# Patient Record
Sex: Male | Born: 1955 | Race: White | Hispanic: No | Smoking: Current every day smoker
Health system: Southern US, Community
[De-identification: ages and names within clinical notes are randomized; demographics above are authoritative.]

## PROBLEM LIST (undated history)

## (undated) DIAGNOSIS — J189 Pneumonia, unspecified organism: Secondary | ICD-10-CM

## (undated) DIAGNOSIS — N400 Enlarged prostate without lower urinary tract symptoms: Secondary | ICD-10-CM

## (undated) DIAGNOSIS — M419 Scoliosis, unspecified: Secondary | ICD-10-CM

## (undated) DIAGNOSIS — D696 Thrombocytopenia, unspecified: Secondary | ICD-10-CM

## (undated) DIAGNOSIS — K589 Irritable bowel syndrome without diarrhea: Secondary | ICD-10-CM

## (undated) DIAGNOSIS — A499 Bacterial infection, unspecified: Secondary | ICD-10-CM

## (undated) DIAGNOSIS — J449 Chronic obstructive pulmonary disease, unspecified: Secondary | ICD-10-CM

## (undated) DIAGNOSIS — K635 Polyp of colon: Secondary | ICD-10-CM

## (undated) DIAGNOSIS — B019 Varicella without complication: Secondary | ICD-10-CM

## (undated) DIAGNOSIS — R569 Unspecified convulsions: Secondary | ICD-10-CM

## (undated) DIAGNOSIS — R519 Headache, unspecified: Secondary | ICD-10-CM

## (undated) DIAGNOSIS — F419 Anxiety disorder, unspecified: Secondary | ICD-10-CM

## (undated) DIAGNOSIS — K219 Gastro-esophageal reflux disease without esophagitis: Secondary | ICD-10-CM

## (undated) DIAGNOSIS — N420 Calculus of prostate: Secondary | ICD-10-CM

## (undated) DIAGNOSIS — F329 Major depressive disorder, single episode, unspecified: Secondary | ICD-10-CM

## (undated) DIAGNOSIS — K449 Diaphragmatic hernia without obstruction or gangrene: Secondary | ICD-10-CM

## (undated) DIAGNOSIS — N390 Urinary tract infection, site not specified: Secondary | ICD-10-CM

## (undated) DIAGNOSIS — F32A Depression, unspecified: Secondary | ICD-10-CM

## (undated) DIAGNOSIS — G43909 Migraine, unspecified, not intractable, without status migrainosus: Secondary | ICD-10-CM

## (undated) DIAGNOSIS — E785 Hyperlipidemia, unspecified: Secondary | ICD-10-CM

## (undated) DIAGNOSIS — M1990 Unspecified osteoarthritis, unspecified site: Secondary | ICD-10-CM

## (undated) DIAGNOSIS — R51 Headache: Secondary | ICD-10-CM

## (undated) DIAGNOSIS — G589 Mononeuropathy, unspecified: Secondary | ICD-10-CM

## (undated) HISTORY — DX: Major depressive disorder, single episode, unspecified: F32.9

## (undated) HISTORY — DX: Benign prostatic hyperplasia without lower urinary tract symptoms: N40.0

## (undated) HISTORY — DX: Thrombocytopenia, unspecified: D69.6

## (undated) HISTORY — DX: Migraine, unspecified, not intractable, without status migrainosus: G43.909

## (undated) HISTORY — DX: Calculus of prostate: N42.0

## (undated) HISTORY — DX: Bacterial infection, unspecified: N39.0

## (undated) HISTORY — DX: Diaphragmatic hernia without obstruction or gangrene: K44.9

## (undated) HISTORY — DX: Chronic obstructive pulmonary disease, unspecified: J44.9

## (undated) HISTORY — DX: Urinary tract infection, site not specified: A49.9

## (undated) HISTORY — DX: Depression, unspecified: F32.A

## (undated) HISTORY — DX: Unspecified osteoarthritis, unspecified site: M19.90

## (undated) HISTORY — PX: OTHER SURGICAL HISTORY: SHX169

## (undated) HISTORY — DX: Headache, unspecified: R51.9

## (undated) HISTORY — PX: APPENDECTOMY: SHX54

## (undated) HISTORY — DX: Hyperlipidemia, unspecified: E78.5

## (undated) HISTORY — DX: Polyp of colon: K63.5

## (undated) HISTORY — DX: Unspecified convulsions: R56.9

## (undated) HISTORY — DX: Headache: R51

## (undated) HISTORY — DX: Irritable bowel syndrome, unspecified: K58.9

## (undated) HISTORY — DX: Pneumonia, unspecified organism: J18.9

## (undated) HISTORY — DX: Anxiety disorder, unspecified: F41.9

## (undated) HISTORY — DX: Mononeuropathy, unspecified: G58.9

## (undated) HISTORY — DX: Scoliosis, unspecified: M41.9

## (undated) HISTORY — DX: Gastro-esophageal reflux disease without esophagitis: K21.9

## (undated) HISTORY — DX: Varicella without complication: B01.9

## (undated) HISTORY — PX: HERNIA REPAIR: SHX51

---

## 1996-02-17 HISTORY — PX: HEMORRHOID SURGERY: SHX153

## 1997-05-23 ENCOUNTER — Ambulatory Visit (HOSPITAL_BASED_OUTPATIENT_CLINIC_OR_DEPARTMENT_OTHER): Admission: RE | Admit: 1997-05-23 | Discharge: 1997-05-23 | Payer: Self-pay | Admitting: *Deleted

## 1998-06-26 ENCOUNTER — Other Ambulatory Visit: Admission: RE | Admit: 1998-06-26 | Discharge: 1998-06-26 | Payer: Self-pay | Admitting: Gastroenterology

## 1998-06-27 ENCOUNTER — Ambulatory Visit (HOSPITAL_COMMUNITY): Admission: RE | Admit: 1998-06-27 | Discharge: 1998-06-27 | Payer: Self-pay | Admitting: Gastroenterology

## 1998-07-17 ENCOUNTER — Ambulatory Visit (HOSPITAL_COMMUNITY): Admission: RE | Admit: 1998-07-17 | Discharge: 1998-07-17 | Payer: Self-pay | Admitting: *Deleted

## 2001-01-06 ENCOUNTER — Observation Stay (HOSPITAL_COMMUNITY): Admission: EM | Admit: 2001-01-06 | Discharge: 2001-01-06 | Payer: Self-pay | Admitting: Emergency Medicine

## 2001-01-06 ENCOUNTER — Encounter: Payer: Self-pay | Admitting: Emergency Medicine

## 2001-10-18 ENCOUNTER — Emergency Department (HOSPITAL_COMMUNITY): Admission: EM | Admit: 2001-10-18 | Discharge: 2001-10-18 | Payer: Self-pay | Admitting: Emergency Medicine

## 2001-10-18 ENCOUNTER — Encounter: Payer: Self-pay | Admitting: Emergency Medicine

## 2004-04-09 ENCOUNTER — Ambulatory Visit: Payer: Self-pay | Admitting: Family Medicine

## 2004-12-22 ENCOUNTER — Ambulatory Visit: Payer: Self-pay | Admitting: Pain Medicine

## 2005-01-26 ENCOUNTER — Ambulatory Visit: Payer: Self-pay | Admitting: Pain Medicine

## 2005-02-16 HISTORY — PX: TRANSURETHRAL RESECTION OF PROSTATE: SHX73

## 2005-11-02 ENCOUNTER — Observation Stay (HOSPITAL_COMMUNITY): Admission: EM | Admit: 2005-11-02 | Discharge: 2005-11-03 | Payer: Self-pay | Admitting: Emergency Medicine

## 2005-11-09 ENCOUNTER — Encounter: Admission: RE | Admit: 2005-11-09 | Discharge: 2006-02-07 | Payer: Self-pay | Admitting: Neurosurgery

## 2006-06-23 ENCOUNTER — Encounter: Admission: RE | Admit: 2006-06-23 | Discharge: 2006-06-23 | Payer: Self-pay | Admitting: Internal Medicine

## 2006-07-26 ENCOUNTER — Ambulatory Visit: Payer: Self-pay | Admitting: Gastroenterology

## 2006-07-28 ENCOUNTER — Ambulatory Visit: Payer: Self-pay | Admitting: Cardiology

## 2006-08-04 ENCOUNTER — Encounter: Admission: RE | Admit: 2006-08-04 | Discharge: 2006-08-04 | Payer: Self-pay | Admitting: Neurosurgery

## 2006-08-12 ENCOUNTER — Ambulatory Visit: Payer: Self-pay | Admitting: Gastroenterology

## 2006-08-12 ENCOUNTER — Encounter: Payer: Self-pay | Admitting: Gastroenterology

## 2006-08-24 ENCOUNTER — Encounter (INDEPENDENT_AMBULATORY_CARE_PROVIDER_SITE_OTHER): Payer: Self-pay | Admitting: Interventional Radiology

## 2006-08-24 ENCOUNTER — Other Ambulatory Visit: Admission: RE | Admit: 2006-08-24 | Discharge: 2006-08-24 | Payer: Self-pay | Admitting: Interventional Radiology

## 2006-08-24 ENCOUNTER — Encounter: Admission: RE | Admit: 2006-08-24 | Discharge: 2006-08-24 | Payer: Self-pay | Admitting: Neurosurgery

## 2006-08-26 ENCOUNTER — Ambulatory Visit: Payer: Self-pay | Admitting: Endocrinology

## 2006-08-26 LAB — CONVERTED CEMR LAB
Free T4: 1.1 ng/dL (ref 0.6–1.6)
TSH: 1.43 microintl units/mL (ref 0.35–5.50)

## 2006-10-06 DIAGNOSIS — Z860100 Personal history of colon polyps, unspecified: Secondary | ICD-10-CM | POA: Insufficient documentation

## 2006-10-06 DIAGNOSIS — K219 Gastro-esophageal reflux disease without esophagitis: Secondary | ICD-10-CM

## 2007-02-23 ENCOUNTER — Ambulatory Visit: Payer: Self-pay | Admitting: Cardiovascular Disease

## 2007-05-23 DIAGNOSIS — K5909 Other constipation: Secondary | ICD-10-CM | POA: Insufficient documentation

## 2007-05-23 DIAGNOSIS — E785 Hyperlipidemia, unspecified: Secondary | ICD-10-CM | POA: Insufficient documentation

## 2007-05-23 DIAGNOSIS — E782 Mixed hyperlipidemia: Secondary | ICD-10-CM | POA: Insufficient documentation

## 2007-05-23 DIAGNOSIS — F341 Dysthymic disorder: Secondary | ICD-10-CM | POA: Insufficient documentation

## 2007-05-24 ENCOUNTER — Ambulatory Visit: Payer: Self-pay | Admitting: Internal Medicine

## 2007-05-24 DIAGNOSIS — J449 Chronic obstructive pulmonary disease, unspecified: Secondary | ICD-10-CM | POA: Insufficient documentation

## 2007-06-06 ENCOUNTER — Ambulatory Visit: Payer: Self-pay | Admitting: Internal Medicine

## 2007-06-06 ENCOUNTER — Encounter: Payer: Self-pay | Admitting: Internal Medicine

## 2007-06-23 ENCOUNTER — Ambulatory Visit: Payer: Self-pay | Admitting: Internal Medicine

## 2007-10-31 ENCOUNTER — Encounter: Admission: RE | Admit: 2007-10-31 | Discharge: 2007-10-31 | Payer: Self-pay | Admitting: Internal Medicine

## 2007-12-08 ENCOUNTER — Encounter: Payer: Self-pay | Admitting: Internal Medicine

## 2007-12-20 ENCOUNTER — Ambulatory Visit: Payer: Self-pay | Admitting: Internal Medicine

## 2008-02-28 ENCOUNTER — Inpatient Hospital Stay (HOSPITAL_COMMUNITY): Admission: EM | Admit: 2008-02-28 | Discharge: 2008-02-28 | Payer: Self-pay | Admitting: Emergency Medicine

## 2008-02-28 ENCOUNTER — Encounter (INDEPENDENT_AMBULATORY_CARE_PROVIDER_SITE_OTHER): Payer: Self-pay | Admitting: Surgery

## 2008-08-31 ENCOUNTER — Encounter: Payer: Self-pay | Admitting: Internal Medicine

## 2008-11-20 IMAGING — US US SOFT TISSUE HEAD/NECK
1 series · 14 of 25 positions shown · non-contrast
Comparison: none

CLINICAL DATA: Nodules identified on previous MRI

[Series 1: us soft tissue head/neck · 0.08mm/px · 14 of 58 slices shown]
[im 1/58]
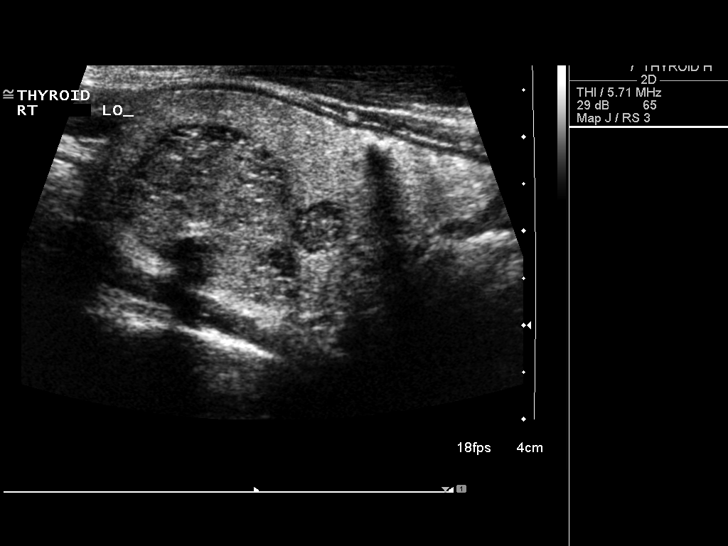
[im 5/58]
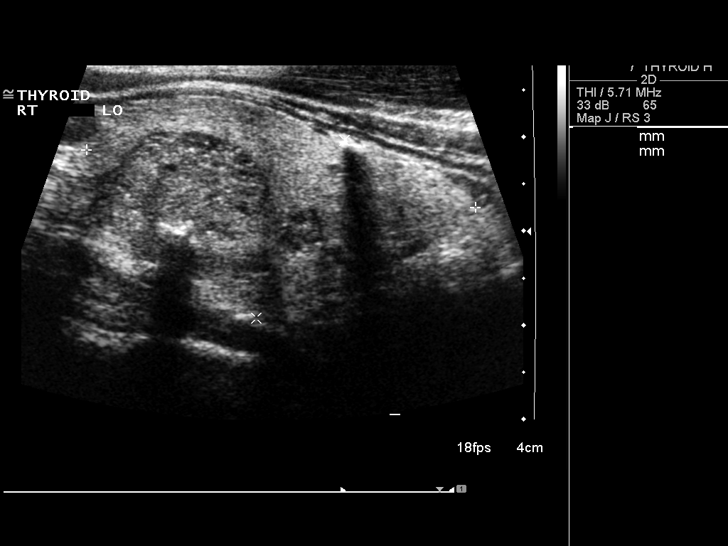
[im 10/58]
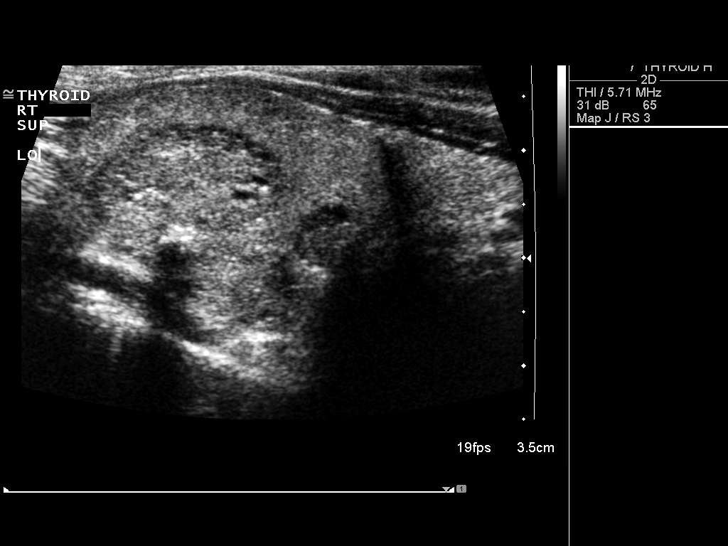
[im 15/58]
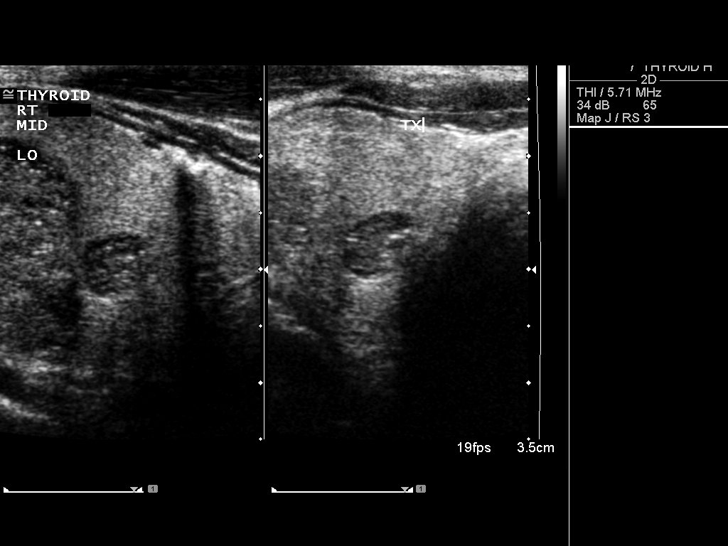
[im 20/58]
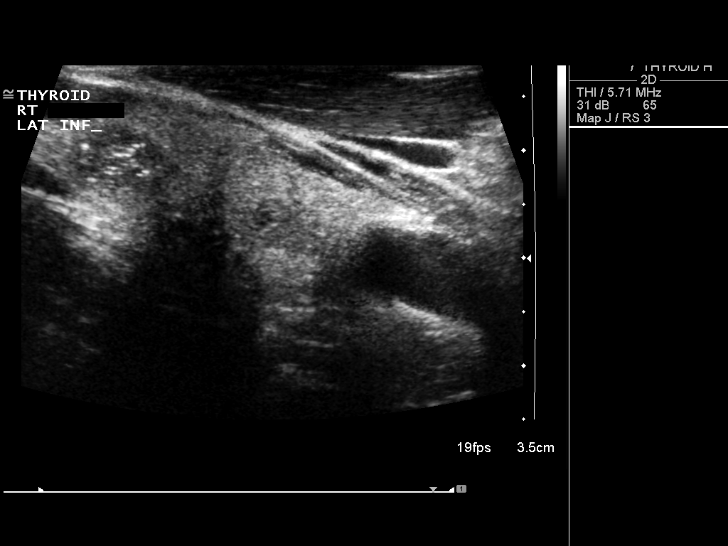
[im 22/58]
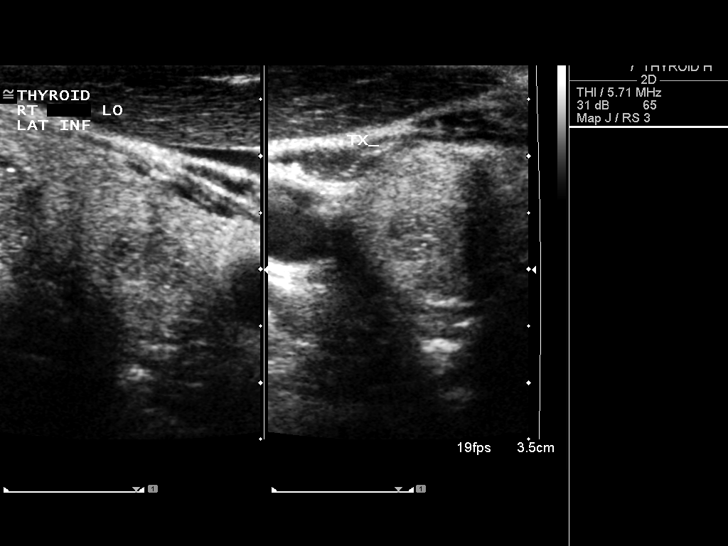
[im 27/58]
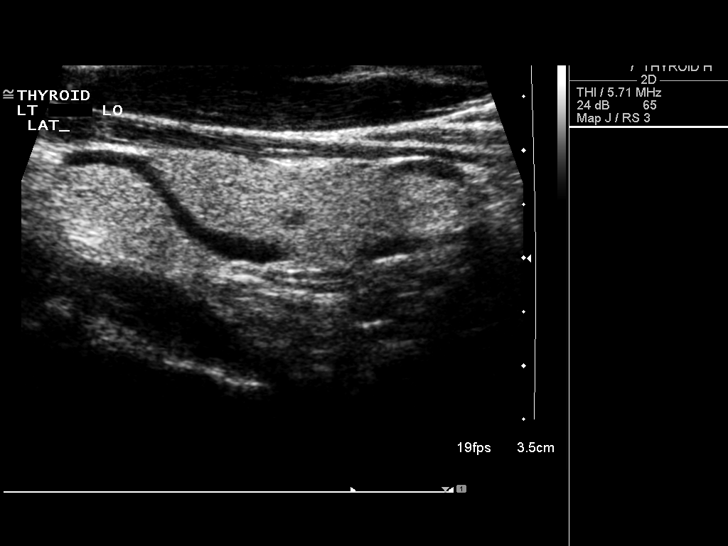
[im 31/58]
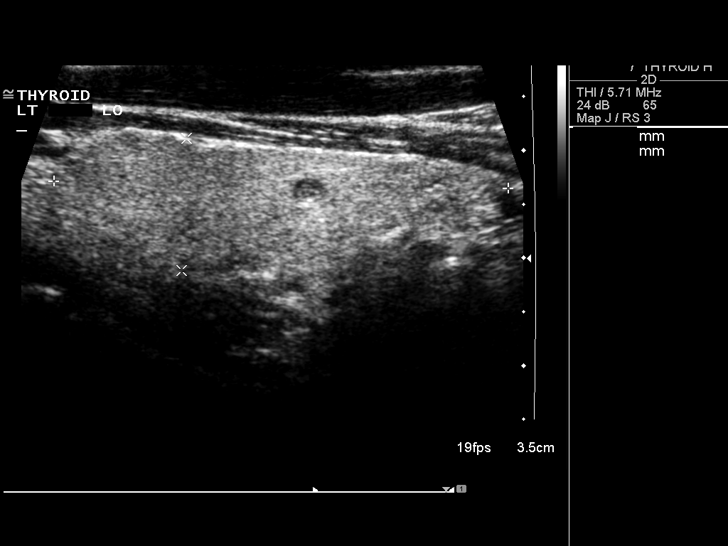
[im 36/58]
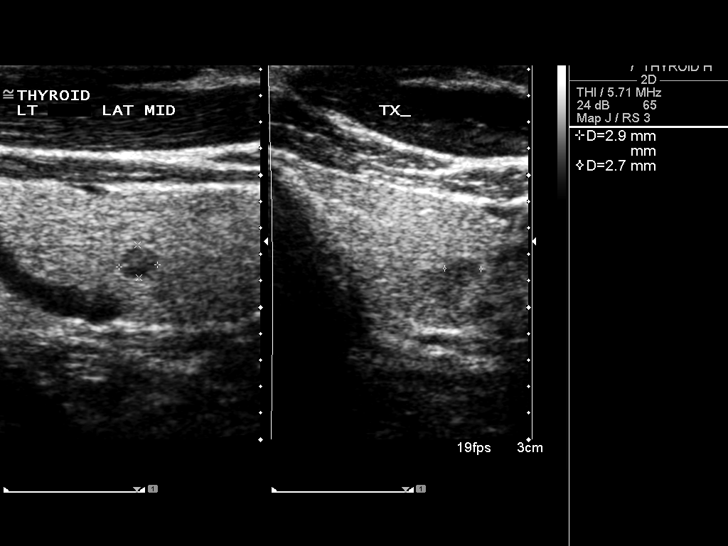
[im 39/58]
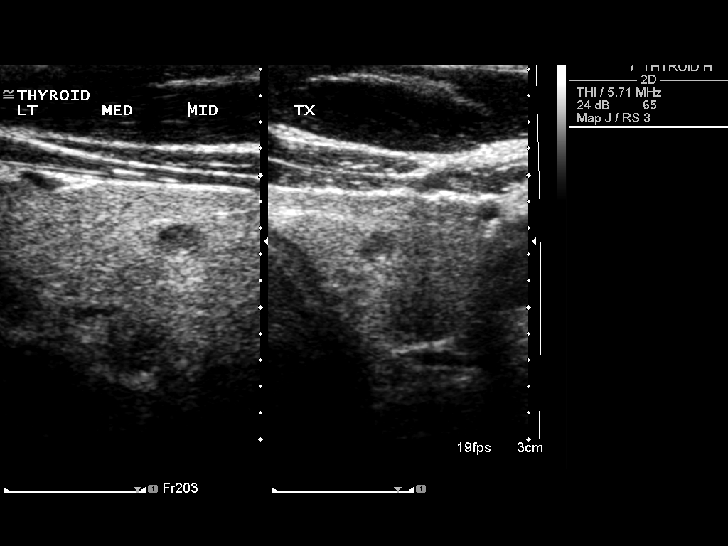
[im 43/58]
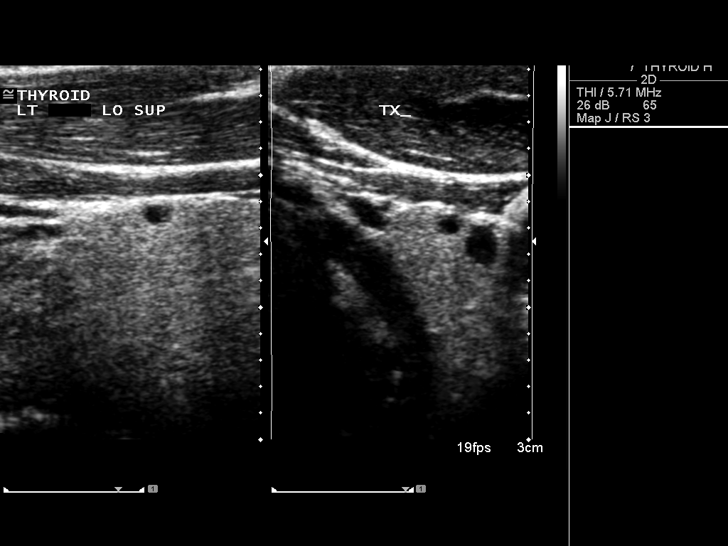
[im 48/58]
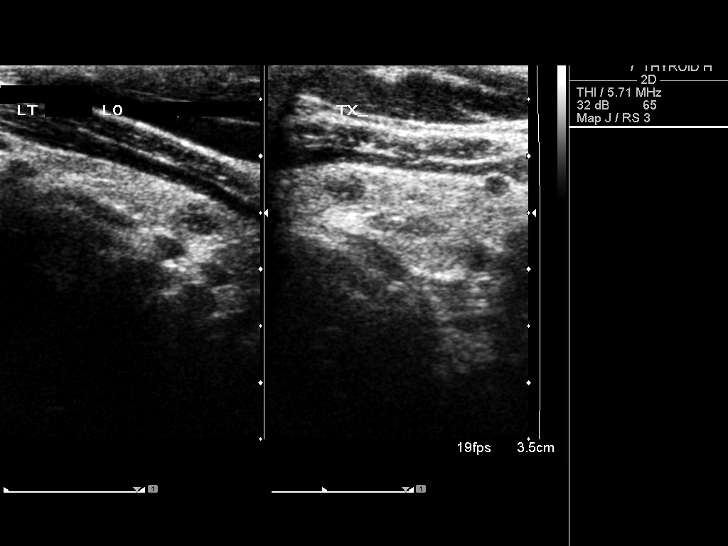
[im 53/58]
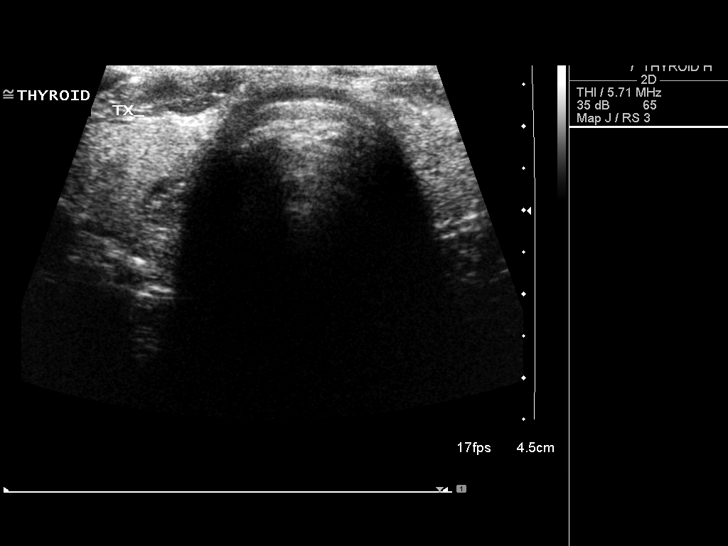
[im 58/58]
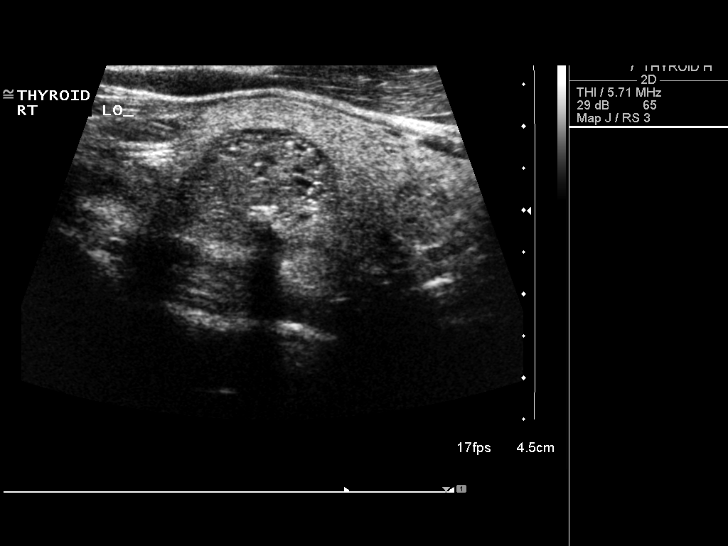

[14 of 25 positions shown; findings below may reference images not displayed]

Ultrasound thyroid:

No previous available for comparison. The right lobe measures 19 x 22 x 44 mm
containing multiple solid-appearing nodules, the largest a dominant bilobed mass
in the mid to upper pole of the right lobe measuring 15 x 18 x 24 mm and
containing microcalcifications. Two smaller subcentimeter nodules are also
evident. Isthmus measures only 2 mm in thickness, unremarkable. The left lobe
measures 13 x 14 x 42 mm, containing scattered tiny hypoechoic or cystic lesions
none measuring greater than 4 mm.
IMPRESSION: 1. Multiple small bilateral thyroid nodules within dominant solid 2.4 cm lesion
in the right lobe. Consider aspiration biopsy to exclude neoplasm.

## 2009-04-25 ENCOUNTER — Encounter: Admission: RE | Admit: 2009-04-25 | Discharge: 2009-04-25 | Payer: Self-pay | Admitting: Internal Medicine

## 2009-04-25 ENCOUNTER — Encounter: Payer: Self-pay | Admitting: Internal Medicine

## 2009-08-13 ENCOUNTER — Encounter: Admission: RE | Admit: 2009-08-13 | Discharge: 2009-08-13 | Payer: Self-pay | Admitting: Internal Medicine

## 2009-11-01 ENCOUNTER — Encounter: Admission: RE | Admit: 2009-11-01 | Discharge: 2009-11-01 | Payer: Self-pay | Admitting: Internal Medicine

## 2010-03-08 ENCOUNTER — Encounter: Payer: Self-pay | Admitting: Internal Medicine

## 2010-03-20 NOTE — Letter (Signed)
Summary: Ralene Ok MD  Ralene Ok MD   Imported By: Sherian Rein 05/20/2009 12:13:18  _____________________________________________________________________  External Attachment:    Type:   Image     Comment:   External Document

## 2010-06-02 LAB — CBC
HCT: 45.7 % (ref 39.0–52.0)
Hemoglobin: 15.5 g/dL (ref 13.0–17.0)
MCHC: 34 g/dL (ref 30.0–36.0)
MCV: 95.4 fL (ref 78.0–100.0)
Platelets: 162 10*3/uL (ref 150–400)
RBC: 4.79 MIL/uL (ref 4.22–5.81)
RDW: 12.9 % (ref 11.5–15.5)
WBC: 15.9 10*3/uL — ABNORMAL HIGH (ref 4.0–10.5)

## 2010-06-02 LAB — URINALYSIS, ROUTINE W REFLEX MICROSCOPIC
Bilirubin Urine: NEGATIVE
Glucose, UA: NEGATIVE mg/dL
Ketones, ur: 40 mg/dL — AB
Leukocytes, UA: NEGATIVE
Nitrite: NEGATIVE
Protein, ur: NEGATIVE mg/dL
Specific Gravity, Urine: 1.017 (ref 1.005–1.030)
Urobilinogen, UA: 1 mg/dL (ref 0.0–1.0)
pH: 7 (ref 5.0–8.0)

## 2010-06-02 LAB — COMPREHENSIVE METABOLIC PANEL
ALT: 11 U/L (ref 0–53)
AST: 27 U/L (ref 0–37)
Albumin: 3.7 g/dL (ref 3.5–5.2)
Alkaline Phosphatase: 122 U/L — ABNORMAL HIGH (ref 39–117)
BUN: 9 mg/dL (ref 6–23)
CO2: 28 mEq/L (ref 19–32)
Calcium: 9.3 mg/dL (ref 8.4–10.5)
Chloride: 101 mEq/L (ref 96–112)
Creatinine, Ser: 0.86 mg/dL (ref 0.4–1.5)
GFR calc Af Amer: >60 mL/min (ref >60)
GFR calc non Af Amer: >60 mL/min (ref >60)
Glucose, Bld: 167 mg/dL — ABNORMAL HIGH (ref 70–99)
Potassium: 5.1 mEq/L (ref 3.5–5.1)
Sodium: 137 mEq/L (ref 135–145)
Total Bilirubin: 1.1 mg/dL (ref 0.3–1.2)
Total Protein: 6.1 g/dL (ref 6.0–8.3)

## 2010-06-02 LAB — URINE MICROSCOPIC-ADD ON

## 2010-06-02 LAB — APTT: aPTT: 33 seconds (ref 24–37)

## 2010-06-02 LAB — DIFFERENTIAL
Basophils Absolute: 0.1 10*3/uL (ref 0.0–0.1)
Basophils Relative: 1 % (ref 0–1)
Eosinophils Absolute: 0 10*3/uL (ref 0.0–0.7)
Eosinophils Relative: 0 % (ref 0–5)
Lymphocytes Relative: 6 % — ABNORMAL LOW (ref 12–46)
Lymphs Abs: 1 10*3/uL (ref 0.7–4.0)
Monocytes Absolute: 0.6 10*3/uL (ref 0.1–1.0)
Monocytes Relative: 4 % (ref 3–12)
Neutro Abs: 14.3 10*3/uL — ABNORMAL HIGH (ref 1.7–7.7)
Neutrophils Relative %: 90 % — ABNORMAL HIGH (ref 43–77)

## 2010-07-01 NOTE — Assessment & Plan Note (Signed)
[facility]                         GASTROENTEROLOGY OFFICE NOTE   [name], [name]                        MRN:          [id number]  DATE:07/26/2006                            DOB:          15-Jan-1956    REASON FOR REFERRAL:  Abdominal pain, constipation, and weight loss.   HISTORY OF PRESENT ILLNESS:  [name] is a 55 year old white male whom  I previously evaluated in 2000.  He underwent colonoscopy and upper  endoscopy at that time for rectal bleeding, a family history of colon  cancer, and chronic GERD.  The upper endoscopy showed a small sliding  hiatal hernia.  Colonoscopy showed 6 rectosigmoid polyps, which were  hyperplastic on biopsy, as well as internal and external hemorrhoids.  That procedure was complicated by postpolypectomy bleed, which resolved  without further intervention.  For persistent epigastric pain and reflux  symptoms, he underwent repeat endoscopy in December 2002 that showed a  mild gastritis.  Biopsies showed only mild reactive changes.  There was  no evidence of Helicobacter pylori.  Abdominal ultrasound imaging was  normal.  For chronic pain related to scoliosis, he has been maintained  on morphine since 2003, and he has had significant constipation since  that time.  For the past 3 months, he has noted worsening problems with  constipation as well as epigastric pain, bloating, frequent nausea, and  a midsternal chest pain.  His reflux symptoms have generally been  controlled on Nexium.  He has a self reported 35 pound weight loss over  the past 3 months.  In December 2002 his weight was 166, and today his  weight is 154.  I have no interval weights.  He has noted a decrease in  appetite, and he has tried several over-the-counter medications for  management of his constipation.  He generally uses PeriColace which has  been effective.  He has not noted any change in his stool caliber,  hematochezia, vomiting,  dysphagia, odynophagia, fevers, or chills.  His  paternal grandmother had colon cancer, and his sister has colon polyps.   PAST MEDICAL HISTORY:  1. Scoliosis with chronic back pain-narcotic dependent.  2. Hyperlipidemia.  3. Anxiety.  4. Depression.  5. Allergic rhinitis.  6. Headaches.  7. Status post left inguinal hernia repair 1997.  8. Internal and external hemorrhoids, status post hemorrhoidectomy      1998.  9. GERD.  10.Chronic constipation.   CURRENT MEDICATIONS:  Listed on the chart, updated and reviewed.   MEDICATION ALLERGIES:  SULFA DRUGS.   SOCIAL HISTORY:  Per the handwritten form.   REVIEW OF SYSTEMS:  Per the handwritten form.   PHYSICAL EXAMINATION:  Well developed, well nourished, chronically ill  appearing.  Height 5 feet 8 inches.  Weight 154 pounds.  Blood pressure is 130/78.  Pulse 76 and regular.  HEENT EXAM:  Anicteric sclerae.  Oropharynx clear.  CHEST:  Clear to auscultation bilaterally.  CARDIAC:  Regular rate and rhythm without murmurs.  ABDOMEN:  Soft with minimal epigastric tenderness to deep palpation.  No  rebound or guarding.  No palpable organomegaly, masses,  or hernia.  Normoactive bowel sounds.  RECTAL:  Examination deferred to the time of colonoscopy.  EXTREMITIES:  Without clubbing, cyanosis, or edema.  NEUROLOGIC:  Alert and oriented x3.  Grossly nonfocal.   ASSESSMENT AND PLAN:  Worsening chronic constipation associated with  self reported weight loss of 35 pounds.  My office records document a  weight loss of only 12 pounds since December 2002, but I have no  interval weights for comparison.  I suspect the majority of his  constipation is related to medication side effects from chronic narcotic  usage.  He is likely having a flare of his reflux symptoms.  Schedule  abdominal and pelvic CT scan as well as colonoscopy to further evaluate.  He is to continue his current laxative regimen and increase Nexium 40 mg  b.i.d., and  re-intensify all antireflux measures.  He is to  substantially increase his fluid and fiber intake.  Risks, benefits, and  alternatives to colonoscopy with possibly biopsy and possible  polypectomy discussed with the patient, and he consents to proceed.  This will be scheduled electively.  Recent CBC and comprehensive  metabolic panel were unremarkable.     Venita Lick. Russella Dar, MD, [facility]  Electronically Signed    MTS/MedQ  DD: 07/28/2006  DT: 07/28/2006  Job #: [id number]   cc:   Ralene Ok, M.D.

## 2010-07-01 NOTE — Op Note (Signed)
Jeffrey Mendez, Jeffrey Mendez                 ACCOUNT NO.:  [id number]   MEDICAL RECORD NO.:  [id number]          PATIENT TYPE:  INP   LOCATION:  2550                         FACILITY:  [facility]   PHYSICIAN:  Maisie Fus A. Cornett, M.D.DATE OF BIRTH:  10-05-1955   DATE OF PROCEDURE:  02/28/2008  DATE OF DISCHARGE:                               OPERATIVE REPORT   PREOPERATIVE DIAGNOSIS:  Acute appendicitis.   POSTOPERATIVE DIAGNOSIS:  Acute appendicitis.   PROCEDURE:  Laparoscopic appendectomy.   SURGEON:  Maisie Fus A. Cornett, MD   ANESTHESIA:  General endotracheal anesthesia with 0.25% Sensorcaine  local.   ESTIMATED BLOOD LOSS:  20 mL.   SPECIMEN:  Appendix to pathology.   DRAINS:  None.   INDICATIONS FOR PROCEDURE:  The patient is a 55 year old male with a 1-  day history of right lower quadrant pain.  Workup revealed acute  appendicitis by CT scanning.  He was brought emergently to the operating  room for a laparoscopic appendectomy.   DESCRIPTION OF PROCEDURE:  The patient was brought to the operating  room.  After the induction of general anesthesia, a Foley catheter was  placed and the abdomen was prepped and draped in sterile fashion.  A 1-  cm supraumbilical incision was made and dissection was carried down into  his fascia.  His fascia was at the midline and grasped with Kocher.  Pursestring suture of 0 Vicryl was placed and a 12-mm Hasson cannula was  placed under direct vision.  Pneumoperitoneum was created with 15 mmHg  of CO2 and laparoscope was placed.  Laparoscopy was performed.  Next,  two 5-mm ports were placed, one in the left lower quadrant and second in  the midline just below the umbilicus.  Appendix was identified and was  acutely inflamed.  It was grabbed by its tip.  The mesoappendix was  taken down with the harmonic scalpel and was hemostatic.  Once I got to  the base, I exchanged the 10-mm scope for a 5-mm scope and placed a GIA  45 stapling device across the  appendix and fired.  The appendix was then  placed in an EndoCatch bag and extracted.  The 10-mm scope was replaced.  The stump was hemostatic as well as the mesentery.  The irrigation was  used and suctioned out.  Re-inspection of the abdominal cavity revealed  no other significant abnormality at this point.  There is no evidence of  bowel injury or colonic injury.  Gallbladder, stomach, and liver all  appeared grossly normal upon examination.  At this point, any excess  irrigation was suctioned out.  We removed our ports with no evidence of  port site bleeding.  These were passed off the field.  The scope was  withdrawn.  Hasson cannula was then removed.  Pursestring suture of 0  Vicryl was then used to close  the fascia.  A 4-0 Monocryl was used to close the skin.  We removed the  Foley in the operating room.  All final counts of sponge, needle, and  instruments were found to be correct at this portion of  the case.  The  patient was then awoke and taken to recovery in satisfactory condition.      Thomas A. Cornett, M.D.  Electronically Signed     TAC/MEDQ  D:  02/28/2008  T:  02/28/2008  Job:  [id number]

## 2010-07-01 NOTE — H&P (Signed)
NAMECAROLOS, FECHER                 ACCOUNT NO.:  [id number]   MEDICAL RECORD NO.:  [id number]          PATIENT TYPE:  INP   LOCATION:  2550                         FACILITY:  [facility]   PHYSICIAN:  Maisie Fus A. Cornett, M.D.DATE OF BIRTH:  August 16, 1955   DATE OF ADMISSION:  02/27/2008  DATE OF DISCHARGE:                              HISTORY & PHYSICAL   CHIEF COMPLAINT:  Right lower quadrant pain.   HISTORY OF PRESENT ILLNESS:  The patient is a 55 year old male with a 1-  day history of periumbilical and now right lower quadrant pain.  The  pain is constant in nature, sharp, increasing since yesterday.  It is  associated with nausea and vomiting.  CT scan was obtained when he  arrived emergency room and showed acute appendicitis.  Pain is a 10/10.  It is made worse by movement.   PAST MEDICAL HISTORY:  1. Seizure disorder.  2. Scoliosis.   PAST SURGICAL HISTORY:  None.   SOCIAL HISTORY:  He does smokes cigarettes.  Denies alcohol use.   Medications include morphine and Nexium.   ALLERGIES:  SULFA.   FAMILY HISTORY:  Noncontributory.   REVIEW OF SYSTEMS:  Otherwise negative except as stated above x15  points.   PHYSICAL EXAMINATION:  VITAL SIGNS:  Temperature 99, pulse 68, and blood  pressure 108/75.  GENERAL APPEARANCE:  A pleasant male in no apparent distress.  HEENT:  Extraocular movements are intact.  Oropharynx is clear.  NECK:  Supple and nontender.  Trachea midline.  CARDIOVASCULAR:  Regular rate and rhythm without murmur or gallop.  EXTREMITIES:  Warm and well perfused.  PULMONARY:  Lungs are clear to auscultation.  Chest wall motion normal.  ABDOMEN:  Tender right lower quadrant.  Rebound and guarding noted.  No  hernia.  EXTREMITIES:  Muscle tone, normal range of motion.  NEUROLOGIC:  Normal.  Glasgow coma scale is 15.  Motor and sensory  functions are intact.   DIAGNOSTIC STUDIES:  CT scan was reviewed which showed acute  appendicitis with perforation.  He has a  white count of 15,000 with left  shift.  Hemoglobin is 15.5.  Sodium 137, potassium 5.1, chloride 101,  CO2 28, BUN 9, and creatinine 0.86.  Urinalysis within normal limits.   IMPRESSION:  Acute appendicitis.   PLAN:  I have recommended laparoscopic appendectomy to the patient.  Risk of bleeding, infection, injury to the organs, and abscess formation  are all potential complications of the procedure.  He understands the  need for the procedure and agrees to proceed.      Thomas A. Cornett, M.D.  Electronically Signed     TAC/MEDQ  D:  02/28/2008  T:  02/28/2008  Job:  [id number]

## 2010-07-01 NOTE — Consult Note (Signed)
[facility]                          ENDOCRINOLOGY CONSULTATION   Jeffrey Mendez, Jeffrey Mendez                        MRN:          [id number]  DATE:08/26/2006                            DOB:          January 22, 1956    REFERRING PHYSICIAN:  Donalee Citrin, M.D.   REASON FOR REFERRAL:  Thyroid nodule.   HISTORY OF THE PRESENT ILLNESS:  A 56 year old man who was recently  noted on an MRI for an unrelated reason to have a right-sided thyroid  nodule.  Symptomatically, he has a 35-pound weight loss over the past 4  months, despite a good appetite.  He also has some slight nausea and  tremor of his hands.  He also has some anxiety, dysuria and urinary  frequency.   PAST MEDICAL HISTORY:  1. Osteoarthritis with a chronic pain syndrome.  2. GERD.   SOCIAL HISTORY:  He is disabled.  He lives with his grandfather.  He is  here with his son.   FAMILY HISTORY:  Mother and several sisters have goiters and  hypothyroidism.   REVIEW OF SYSTEMS:  Denies fever and syncope.   PHYSICAL EXAMINATION:  Blood pressure is 113/71, heart rate 75,  temperature is 97.7 and the weight is 155.  GENERAL:  No distress.  SKIN:  Not diaphoretic.  No rash.  HEENT:  No proptosis. No periorbital swelling.  NECK:  There is a 2-cm-diameter right-sided thyroid nodule.  CHEST:  Clear to auscultation.  No respiratory distress.  CARDIOVASCULAR:  There is trace bilateral pretibial edema.  Regular rate  and rhythm.  No murmur.  Pedal pulses are intact.  NEUROLOGIC:  Alert and well-oriented.  Does not appear anxious nor  depressed and there is a slight postural tremor present.   LABORATORY STUDIES:  Cytology from the right-sided thyroid biopsy is  benign.  TSH and free T4 are normal on August 26, 2006.   A thyroid ultrasound on August 04, 2006 shows multiple nodules with the  largest on the right 2.4 cm in diameter.   Urinalysis positive for blood.   IMPRESSION:  1. 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  2. Euthyroid.  3. Symptom complex including weight loss as noted above, not thyroid-      related.  4. Incidentally noted hematuria, which in a smoker with weight loss      should be aggressively worked up.   PLAN:  1. I left the patient a message advising him to return here in about 6      months.  2. He is referred to a urologist.  3. He should have any further indicated workup also for his weight      loss.     Sean A. Everardo All, MD  Electronically Signed    SAE/MedQ  DD: 08/28/2006  DT: 08/30/2006  Job #: [id number]   cc:   Donalee Citrin, M.D.  Ralene Ok, M.D.

## 2010-07-04 NOTE — Discharge Summary (Signed)
NAMEKHAZA, BLANSETT                 ACCOUNT NO.:  [id number]   MEDICAL RECORD NO.:  [id number]          PATIENT TYPE:  OBV   LOCATION:  3020                         FACILITY:  [facility]   PHYSICIAN:  Deanna Artis. Hickling, M.D.DATE OF BIRTH:  1955/10/03   DATE OF ADMISSION:  11/02/2005  DATE OF DISCHARGE:  11/03/2005                                 DISCHARGE SUMMARY   FINAL DIAGNOSES:  1. Dysarthria 783.5.  2. Numbness 782.0.  3. Transient weakness nondominant hemisphere 344.82.   PROCEDURES:  CT of brain, MRI of the brain, MRA intracranial, 2-D  echocardiogram, carotid Doppler,  multiple lab tests.   COMPLICATIONS:  None.   SUMMARY OF HOSPITALIZATION:  The patient had an MRI scan of the brain which  was entirely normal other than some mild intracranial atherosclerotic  changes that are remote.  No acute lesions were seen.  No sign of vessel  occlusion was seen.  Pending studies at this time include 2-D  echocardiogram, carotid Doppler and transcranial Doppler, and also  laboratory studies that were drawn.   The patient has a history of left focal seizures, but also nonepileptic  seizures, 345.50, 370.39.  This is in the process of evaluation.   The patient's hospital course was one of stability.  As he arrived in the  hospital, he no longer had his symptoms.  On discharge, blood pressure  113/75, resting pulse 61, respirations 20, oxygen saturation 92%,  temperature 98.  He had a nonfocal neurologic examination.   LABORATORY STUDIES:  Sodium 141, potassium 4.2, chloride 112, CO2 of 23,  glucose 113, BUN 6, creatinine 1.1, calcium 9.3, total protein 5.8, albumin  3.5, SGOT 21, SGPT 15, alkaline phosphatase 100, total bilirubin 0.6.  Urinalysis with specific gravity of 1.006, pH of 6.0.  Urine chemistries  were unremarkable.  Urine and microscopic were also unremarkable with 0-2  red blood cells and white blood cells.  PT 12.9, PTT 29.  White blood cell  count 11,400, hemoglobin  15.4, hematocrit 44.1, MCV 95.0, platelet count  152,000.   The patient was discharged in stable condition.   DISCHARGE MEDICATIONS:  1. Aspirin 325 mg daily.  2. Protonix 40 mg daily.  3. Trazodone 75 mg daily.  4. Dilaudid 8 mg 3 times daily.  5. Topamax 100 mg daily.   He is to contact Dr. Deneen Harts [phone].  I do not think that this  episode represented ischemic vascular disease.  The patient was discharged  before all laboratories were completed and reflects that belief.  We will  contact the patient if there is anything that contradicts that.      Deanna Artis. Sharene Skeans, M.D.  Electronically Signed     WHH/MEDQ  D:  11/03/2005  T:  11/04/2005  Job:  [id number]   cc:   Bevelyn Buckles. Nash Shearer, M.D.

## 2010-07-04 NOTE — H&P (Signed)
NAMEESTER, MABE                 ACCOUNT NO.:  [id number]   MEDICAL RECORD NO.:  [id number]          PATIENT TYPE:  EMS   LOCATION:  MAJO                         FACILITY:  [facility]   PHYSICIAN:  Deanna Artis. Hickling, M.D.DATE OF BIRTH:  1955-02-28   DATE OF ADMISSION:  11/02/2005  DATE OF DISCHARGE:                                HISTORY & PHYSICAL   CHIEF COMPLAINT:  Code stroke.   HISTORY OF PRESENT CONDITION:  The patient had onset at 11:15 a.m. with  swelling of his tongue, dysarthria of speech, swimmy-headed, unsteady gait,  tremors of his head and trunk.  He called EMS around 11:20 a.m..  EMS  arrived 11:36.  Code Stroke was called en route at 11:56.  The patient  arrived at [facility] 12:10.  The patient had CT scan at 12:14 read by me  STAT.  Symptoms have completely cleared.   PAST MEDICAL HISTORY:  Diagnosis epilepsy one and a half years ago at [facility] by Dr. Chriss Czar.  Recent evaluation by Dr. Imagene Gurney  negative EEG and MRI scan with some few subcortical white matter  hyperintensities of uncertain significance.  MR of the C-spine suggests a  herniated nucleus pulposus locations unclear.  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  He has chronic  back pain.  He is an anxious person.  He has had some shaking spells all  over without loss of consciousness, gastroesophageal reflux disease and  insomnia.   PAST SURGICAL HISTORY:  Bilateral left inguinal herniorrhaphy with a second  procedure the same site.   REVIEW OF SYSTEMS:  12 system review is negative except as noted above.   FAMILY HISTORY:  Positive for stroke in at least one grandparent.  His  parents and other family members are alive and well.   SOCIAL HISTORY:  The patient smoked two packs per day, onset age 37 to the  present.  He rarely drinks alcohol.  He is disabled and has not worked since  2003.  This episode began as he came out of the  court house related to  some  sort of domestic dispute. He has been under a great deal of stress with all  of this.  I did not ask for details.  The patient is disabled was not worked  since a since 2003.   EXAMINATION:  Temperature 97.7, blood pressure 123/77, resting pulse 88,  respirations 18, oxygen saturation 96%.  EAR, NOSE AND THROAT:  No infection or bruits.  LUNGS: Clear.  HEART:  No murmurs.  Pulses normal.  ABDOMEN:  Soft.  Bowel sounds normal.  No hepatosplenomegaly.  EXTREMITIES: Unremarkable.  NEUROLOGIC:  The patient is awake, alert without dysphasia.  CRANIAL NERVES:  Round reactive pupils.  Fundi normal.  Visual fields full and symmetric  facial strength.  Midline tongue and uvula.  Air conduction greater than  bone conduction.  The patient wears glasses.  Motor examination normal  strength.  No drift.  Fine motor movements are normal.  Sensation intact to  primary and  cortical modalities.  Cerebellar examination good finger-to-  nose, heel-knee-shin, rapid repetitive movements okay.  Gait was not tested.  Deep tendon reflexes were normal proximally and distally.  The patient had  bilateral flexor plantar responses.   IMPRESSION:  1. Dysarthria 784.5.  2. Swelling of the tongue and tingling 782.0.  Nonfocal weakness.  This      may be a TIA.  We need to treat it as such and evaluate the patient.  3. 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   PLAN:  Admit, complete neuro workup for TIA.  This will include an MRI scan  of the brain MRA intracranial, 2-D echocardiogram, carotid Doppler, TCD and  laboratories in morning for fasting lipids and homocystine and hemoglobin  A1c.  I have counseled him about smoking cessation.  He can take medications  and eat without a swallowing screen because this is a TIA. He is not a  candidate for TPA or any interventional studies.  There is no reason to  provide DVT prophylaxis because he is ambulatory.  We will start him on   aspirin 325 mg per day.      Deanna Artis. Sharene Skeans, M.D.  Electronically Signed     WHH/MEDQ  D:  11/02/2005  T:  11/02/2005  Job:  [id number]   cc:   Bevelyn Buckles. Nash Shearer, M.D.

## 2010-11-18 ENCOUNTER — Ambulatory Visit
Admission: RE | Admit: 2010-11-18 | Discharge: 2010-11-18 | Disposition: A | Discharge status: Home / Self Care | Payer: Medicare PPO | Source: Ambulatory Visit | Attending: Internal Medicine | Admitting: Internal Medicine

## 2010-11-18 ENCOUNTER — Other Ambulatory Visit: Payer: Self-pay | Admitting: Internal Medicine

## 2010-11-18 DIAGNOSIS — R05 Cough: Secondary | ICD-10-CM

## 2010-11-28 ENCOUNTER — Encounter: Payer: Self-pay | Admitting: Gastroenterology

## 2010-12-22 ENCOUNTER — Encounter: Payer: Self-pay | Admitting: Gastroenterology

## 2010-12-22 ENCOUNTER — Ambulatory Visit (INDEPENDENT_AMBULATORY_CARE_PROVIDER_SITE_OTHER): Payer: Medicare PPO | Admitting: Gastroenterology

## 2010-12-22 VITALS — BP 118/82 | HR 74 | Ht 68.0 in | Wt 171.0 lb

## 2010-12-22 DIAGNOSIS — K219 Gastro-esophageal reflux disease without esophagitis: Secondary | ICD-10-CM

## 2010-12-22 DIAGNOSIS — R1319 Other dysphagia: Secondary | ICD-10-CM

## 2010-12-22 DIAGNOSIS — R634 Abnormal weight loss: Secondary | ICD-10-CM

## 2010-12-22 DIAGNOSIS — K5900 Constipation, unspecified: Secondary | ICD-10-CM

## 2010-12-22 NOTE — Patient Instructions (Addendum)
You have been given a separate informational sheet regarding your tobacco use, the importance of quitting and local resources to help you quit.  Start Miralax mixing 17 grams in 8 oz of water twice daily. Decrease stool softeners to 2-3 daily once your Miralax has started moving your bowels.   You have been scheduled for a Upper Endoscopy with propofol. See separate instructions.  Also you have been scheduled for a Barium Esophagram at [facility] on 12/24/10 at 11:00am. Please arrive 15 minutes early for registration.  cc: Ralene Ok, MD

## 2010-12-22 NOTE — Progress Notes (Signed)
History of Present Illness: This is a 55 year old male seen in the past for her constipation, abdominal pain and weight loss. He underwent upper endoscopy in December 2002 showed mild gastritis and he was treated for GERD with Nexium twice a day. He underwent colonoscopy in June 2008 for constipation and weight loss showing internal hemorrhoids and hyperplastic colon polyps. Over the past 6 months he has noted difficulty swallowing liquids and solids. In addition he has chronic constipation and chronic abdominal bloating which is exacerbated by meals. He has decreased his oral intake secondary to his postprandial bloating. He takes 10 Colace per day to help with bowel movements. He is maintained on MS Contin and oxycodone. He states he has lost 20 pounds over the past 3 months. In June 2008 was 154 pounds, in October 2012 he was 166 pounds and today he is 171 pounds. Denies weight loss, diarrhea, change in stool caliber, melena, hematochezia, nausea, vomiting, chest pain.  Review of Systems: Pertinent positive and negative review of systems were noted in the above HPI section. All other review of systems were otherwise negative.  Current Medications, Allergies, Past Medical History, Past Surgical History, Family History and Social History were reviewed in [employer] record.  Physical Exam: General: Well developed , well nourished, heavy smell of cigarette smoke, no acute distress Head: Normocephalic and atraumatic Eyes:  sclerae anicteric, EOMI Ears: Normal auditory acuity Mouth: No deformity or lesions Neck: Supple, no masses or thyromegaly Lungs: Clear throughout to auscultation Heart: Regular rate and rhythm; no murmurs, rubs or bruits Abdomen: Soft, non tender and non distended. No masses, hepatosplenomegaly or hernias noted. Normal Bowel sounds Musculoskeletal: Symmetrical with no gross deformities  Skin: No lesions on visible extremities Pulses:  Normal pulses  noted Extremities: No clubbing, cyanosis, edema or deformities noted Neurological: Alert oriented x 4, grossly nonfocal Cervical Nodes:  No significant cervical adenopathy Inguinal Nodes: No significant inguinal adenopathy Psychological:  Alert and cooperative. Normal mood and affect  Assessment and Recommendations:  1. Solid and liquid dysphagia. Rule out esophageal strictures, motility disorders and esophagitis. Continue standard antireflux measures and Nexium 40 mg every morning. Further evaluation with barium esophagram upper endoscopy with possible dilation with propofol sedation.The risks, benefits, and alternatives to endoscopy with possible biopsy and possible dilation were discussed with the patient and they consent to proceed.   2. Chronic constipation with bloating and weight loss. I feel his weight loss is likely secondary to decreased oral intake because of significant postprandial abdominal bloating and discomfort. Begin MiraLax twice daily and may increase to 3 or 4 times daily as necessary. Once his constipation is better managed I recommend decreasing Colace to 2 or 3 tablets daily. If his weight loss persists consider further evaluation with chest and abdominal imaging studies.

## 2010-12-24 ENCOUNTER — Telehealth: Payer: Self-pay

## 2010-12-24 ENCOUNTER — Ambulatory Visit (HOSPITAL_COMMUNITY)
Admission: RE | Admit: 2010-12-24 | Discharge: 2010-12-24 | Disposition: A | Discharge status: Home / Self Care | Payer: Medicare PPO | Source: Ambulatory Visit | Attending: Gastroenterology | Admitting: Gastroenterology

## 2010-12-24 DIAGNOSIS — R1310 Dysphagia, unspecified: Secondary | ICD-10-CM | POA: Insufficient documentation

## 2010-12-24 DIAGNOSIS — K219 Gastro-esophageal reflux disease without esophagitis: Secondary | ICD-10-CM | POA: Insufficient documentation

## 2010-12-24 DIAGNOSIS — R634 Abnormal weight loss: Secondary | ICD-10-CM | POA: Insufficient documentation

## 2010-12-24 DIAGNOSIS — R1319 Other dysphagia: Secondary | ICD-10-CM

## 2010-12-24 DIAGNOSIS — K449 Diaphragmatic hernia without obstruction or gangrene: Secondary | ICD-10-CM | POA: Insufficient documentation

## 2010-12-24 MED ORDER — ESOMEPRAZOLE MAGNESIUM 40 MG PO CPDR: 40.0000 mg | DELAYED_RELEASE_CAPSULE | Freq: Two times a day (BID) | ORAL | Status: DC

## 2010-12-24 NOTE — Telephone Encounter (Signed)
Result Note     Marked GERD No strictures Increase Nexium to 40 mg po bid   Notified patient of Barium Swallow results and sent to the pharmacy Nexium twice daily dosing. Pt verbalized understanding.

## 2010-12-25 ENCOUNTER — Telehealth: Payer: Self-pay | Admitting: *Deleted

## 2010-12-25 NOTE — Telephone Encounter (Signed)
Spoke to representative from [facility] prescription coverage.  Tried to do the prior authorization over the phone but the rep told me that they will notify us within 72 hours with the answer.  I did answer all the pertinent questions regarding the patients diagnosis and reason they take Nexium 40 mg, 1 cap PO BID.

## 2010-12-26 NOTE — Telephone Encounter (Signed)
Received denial for Nexium 40 mg tablets twice daily dosing.

## 2011-01-14 ENCOUNTER — Encounter: Payer: Medicare PPO | Admitting: Gastroenterology

## 2011-01-26 ENCOUNTER — Encounter: Payer: Self-pay | Admitting: *Deleted

## 2011-02-03 ENCOUNTER — Ambulatory Visit
Admission: RE | Admit: 2011-02-03 | Discharge: 2011-02-03 | Disposition: A | Discharge status: Home / Self Care | Payer: Medicare PPO | Source: Ambulatory Visit | Attending: Internal Medicine | Admitting: Internal Medicine

## 2011-02-03 ENCOUNTER — Other Ambulatory Visit: Payer: Self-pay | Admitting: Internal Medicine

## 2011-02-03 DIAGNOSIS — M545 Low back pain: Secondary | ICD-10-CM

## 2012-02-26 DIAGNOSIS — M5416 Radiculopathy, lumbar region: Secondary | ICD-10-CM | POA: Insufficient documentation

## 2012-02-26 DIAGNOSIS — M51369 Other intervertebral disc degeneration, lumbar region without mention of lumbar back pain or lower extremity pain: Secondary | ICD-10-CM | POA: Insufficient documentation

## 2012-04-12 ENCOUNTER — Other Ambulatory Visit: Payer: Self-pay | Admitting: Internal Medicine

## 2012-04-19 ENCOUNTER — Ambulatory Visit
Admission: RE | Admit: 2012-04-19 | Discharge: 2012-04-19 | Disposition: A | Discharge status: Home / Self Care | Payer: Medicare Other | Source: Ambulatory Visit | Attending: Internal Medicine | Admitting: Internal Medicine

## 2013-01-06 ENCOUNTER — Other Ambulatory Visit: Payer: Self-pay | Admitting: Internal Medicine

## 2013-01-06 ENCOUNTER — Ambulatory Visit
Admission: RE | Admit: 2013-01-06 | Discharge: 2013-01-06 | Disposition: A | Discharge status: Home / Self Care | Payer: Medicare Other | Source: Ambulatory Visit | Attending: Internal Medicine | Admitting: Internal Medicine

## 2013-01-06 DIAGNOSIS — J449 Chronic obstructive pulmonary disease, unspecified: Secondary | ICD-10-CM

## 2013-01-06 DIAGNOSIS — R079 Chest pain, unspecified: Secondary | ICD-10-CM

## 2013-05-06 DIAGNOSIS — M542 Cervicalgia: Secondary | ICD-10-CM | POA: Insufficient documentation

## 2013-08-15 ENCOUNTER — Encounter (HOSPITAL_COMMUNITY): Payer: Self-pay | Admitting: Emergency Medicine

## 2013-08-15 ENCOUNTER — Emergency Department (HOSPITAL_COMMUNITY): Payer: Medicare Other

## 2013-08-15 ENCOUNTER — Emergency Department (HOSPITAL_COMMUNITY)
Admission: EM | Admit: 2013-08-15 | Discharge: 2013-08-16 | Disposition: A | Discharge status: Home / Self Care | Payer: Medicare Other | Attending: Emergency Medicine | Admitting: Emergency Medicine

## 2013-08-15 DIAGNOSIS — Z8669 Personal history of other diseases of the nervous system and sense organs: Secondary | ICD-10-CM | POA: Insufficient documentation

## 2013-08-15 DIAGNOSIS — Z79899 Other long term (current) drug therapy: Secondary | ICD-10-CM | POA: Insufficient documentation

## 2013-08-15 DIAGNOSIS — R5382 Chronic fatigue, unspecified: Secondary | ICD-10-CM

## 2013-08-15 DIAGNOSIS — F17200 Nicotine dependence, unspecified, uncomplicated: Secondary | ICD-10-CM | POA: Insufficient documentation

## 2013-08-15 DIAGNOSIS — F411 Generalized anxiety disorder: Secondary | ICD-10-CM | POA: Insufficient documentation

## 2013-08-15 DIAGNOSIS — Z8639 Personal history of other endocrine, nutritional and metabolic disease: Secondary | ICD-10-CM | POA: Insufficient documentation

## 2013-08-15 DIAGNOSIS — F329 Major depressive disorder, single episode, unspecified: Secondary | ICD-10-CM

## 2013-08-15 DIAGNOSIS — Z8739 Personal history of other diseases of the musculoskeletal system and connective tissue: Secondary | ICD-10-CM | POA: Insufficient documentation

## 2013-08-15 DIAGNOSIS — J449 Chronic obstructive pulmonary disease, unspecified: Secondary | ICD-10-CM | POA: Insufficient documentation

## 2013-08-15 DIAGNOSIS — Z87448 Personal history of other diseases of urinary system: Secondary | ICD-10-CM | POA: Insufficient documentation

## 2013-08-15 DIAGNOSIS — J4489 Other specified chronic obstructive pulmonary disease: Secondary | ICD-10-CM | POA: Insufficient documentation

## 2013-08-15 DIAGNOSIS — F32A Depression, unspecified: Secondary | ICD-10-CM

## 2013-08-15 DIAGNOSIS — K219 Gastro-esophageal reflux disease without esophagitis: Secondary | ICD-10-CM | POA: Insufficient documentation

## 2013-08-15 DIAGNOSIS — Z862 Personal history of diseases of the blood and blood-forming organs and certain disorders involving the immune mechanism: Secondary | ICD-10-CM | POA: Insufficient documentation

## 2013-08-15 DIAGNOSIS — D696 Thrombocytopenia, unspecified: Secondary | ICD-10-CM | POA: Insufficient documentation

## 2013-08-15 DIAGNOSIS — G9332 Myalgic encephalomyelitis/chronic fatigue syndrome: Secondary | ICD-10-CM | POA: Insufficient documentation

## 2013-08-15 DIAGNOSIS — R0789 Other chest pain: Secondary | ICD-10-CM | POA: Insufficient documentation

## 2013-08-15 DIAGNOSIS — F3289 Other specified depressive episodes: Secondary | ICD-10-CM | POA: Insufficient documentation

## 2013-08-15 DIAGNOSIS — Z720 Tobacco use: Secondary | ICD-10-CM

## 2013-08-15 LAB — CBC
HEMATOCRIT: 43.7 % (ref 39.0–52.0)
Hemoglobin: 15.4 g/dL (ref 13.0–17.0)
MCH: 33.6 pg (ref 26.0–34.0)
MCHC: 35.2 g/dL (ref 30.0–36.0)
MCV: 95.4 fL (ref 78.0–100.0)
Platelets: 187 10*3/uL (ref 150–400)
RBC: 4.58 MIL/uL (ref 4.22–5.81)
RDW: 13 % (ref 11.5–15.5)
WBC: 9.4 10*3/uL (ref 4.0–10.5)

## 2013-08-15 LAB — BASIC METABOLIC PANEL
BUN: 6 mg/dL (ref 6–23)
CHLORIDE: 103 meq/L (ref 96–112)
CO2: 24 mEq/L (ref 19–32)
CREATININE: 0.96 mg/dL (ref 0.50–1.35)
Calcium: 9.2 mg/dL (ref 8.4–10.5)
GFR calc Af Amer: >90 mL/min (ref >90)
GFR calc non Af Amer: >90 mL/min (ref >90)
Glucose, Bld: 148 mg/dL — ABNORMAL HIGH (ref 70–99)
Potassium: 4 mEq/L (ref 3.7–5.3)
Sodium: 142 mEq/L (ref 137–147)

## 2013-08-15 LAB — TROPONIN I: Troponin I: <0.3 ng/mL (ref <0.30)
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
## 2013-08-15 NOTE — ED Notes (Signed)
Patient presents with numerous complaints.  Stated that he was going to the pain clinic in [place] and was kicked out 2 1/2 months ago.  He was taking Morphine 180mg  and Percocet 40mg  dailyl  Stated he went cold [place].  States he has chest discomfort everytime he does anything since he was taken off the meds.  Has chronic back pain, cyst on his thyroid.

## 2013-08-15 NOTE — ED Notes (Signed)
Pt stated he was kicked out of pain clinic in [place] due to "breaking the rules" because they did a urine drug screen and did not find percocet in his urine.  He was on morphine and percocet daily.  He states they accused him of stockpiling or selling the percocet, which pt denies.  He then called his PMD to get medication, his PMD refused to give him the narcotics so he fired his PMD.  States he went through withdrawls at home on his own and ever since he's been out of energy, weak, tired, reports central anterior CP with any activity and diarrhea daily.  Reports he even has to force himself to shower and brush his teeth to go see his parents weekly.  He feels something must be wrong that this has happened since "[place]" stopping his pain meds.  Continues to smoke daily and reports emphysema/COPD.

## 2013-08-16 LAB — TROPONIN I: Troponin I: <0.3 ng/mL (ref <0.30)

## 2013-08-16 MED ORDER — ASPIRIN 81 MG PO CHEW: 81.0000 mg | CHEWABLE_TABLET | Freq: Every day | ORAL | Status: DC

## 2013-08-16 NOTE — Discharge Instructions (Signed)
°Emergency Department Resource Guide °1) Find a Doctor and Pay Out of Pocket °Although you won't have to find out who is covered by your insurance plan, it is a good idea to ask around and get recommendations. You will then need to call the office and see if the doctor you have chosen will accept you as a new patient and what types of options they offer for patients who are self-pay. Some doctors offer discounts or will set up payment plans for their patients who do not have insurance, but you will need to ask so you aren't surprised when you get to your appointment. ° °2) Contact Your Local Health Department °Not all health departments have doctors that can see patients for sick visits, but many do, so it is worth a call to see if yours does. If you don't know where your local health department is, you can check in your phone book. The CDC also has a tool to help you locate your state's health department, and many state websites also have listings of all of their local health departments. ° °3) Find a Walk-in Clinic °If your illness is not likely to be very severe or complicated, you may want to try a walk in clinic. These are popping up all over the country in pharmacies, drugstores, and shopping centers. They're usually staffed by nurse practitioners or physician assistants that have been trained to treat common illnesses and complaints. They're usually fairly quick and inexpensive. However, if you have serious medical issues or chronic medical problems, these are probably not your best option. ° °No Primary Care Doctor: °- Call [facility] at  832-8000 - they can help you locate a primary care doctor that  accepts your insurance, provides certain services, etc. °- Physician Referral Service- 1-800-533-3463 ° °Chronic Pain Problems: °Organization         Address  Phone   Notes  °[facility]  ([phone]) [phone] Patients need to be referred by their primary care doctor.  ° °Medication  Assistance: °Organization         Address  Phone   Notes  °[place] [facility] [address] °[place], [place] [address] (336) 641-8030 --Must be a resident of [place] °-- Must have NO insurance coverage whatsoever (no Medicaid/ Medicare, etc.) °-- The pt. MUST have a primary care doctor that directs their care regularly and follows them in the community °  °[facility]  (866) 331-1348   °[employer]  (888) 892-1162   ° °Agencies that provide inexpensive medical care: °Organization         Address  Phone   Notes  °[facility]  (336) 832-8035   °[facility]    (336) 832-7272   °[facility] [address] °[place], [place] [address] (336) 832-4777   °[facility] [address], °[place] (336) 271-4999   °[facility]    (336) 373-0678   °[facility]    (336) 272-1050   °[facility] ° [address], [place] Phone:  (336) 832-4444, Fax:  (336) 832-4440 Hours of Operation:  9 am - 6 pm, M-F.  Also accepts Medicaid/Medicare and self-pay.  °[facility] ° [address], [place] Phone: (336) [phone], Fax: (336) [phone]. Hours of Operation:  8:30 am - 5:30 pm, M-F.  Also accepts Medicaid and self-pay.  °[facility] [place] [address], [place] Phone: (336) 878-6027   °[facility] [address], [place], [place] (336)723-1848, Ext. 123 Mondays & Thursdays: 7-9 AM.  First 15 patients are seen on a first come, first serve basis. °  ° °Medicaid-accepting [place] Providers: ° °Organization         Address  Phone   Notes  °[facility] [address], [place] (336) 641-2100 Also accepts self-pay patients.  °[facility] [address], [place] ° (336) 856-9996   °[facility] [address], [place]  (336) 288-8857   °[facility] [address] [place] [address], [place] (336) 299-7000   °Veita Bland [address], [place]  ° (336) 373-1557 Only accepts [place] Access Medicaid patients after they have their name applied to their card.  ° °Self-Pay (no insurance) in [place]: ° °Organization         Address  Phone   Notes  °Sickle Cell Patients, [facility] [address], [place] (336) 832-1970   °[facility] [address], [place] (336) 832-4400   °[facility] ° [address], [place] (336) 992-4800   °[facility]/Dr. Osei-Bonsu ° [address] [place] [address], [place] or [address], [place] (336) [phone] Phone number for both [place] and [place] locations is the same.  °[facility] [address], [place] (336) 299-0000   °[facility] [address] [place] [address], [place] or [address] (336) 852-7530 °(336) 878-2260   °[facility] [address], [place] (336) 350-1642, phone; (336) 294-5005, fax Sees patients 1st and 3rd Saturday of every month.  Must not qualify for public or private insurance (i.e. Medicaid, Medicare, [place] Health Choice, Veterans' Benefits) • Household income should be no more than 200% of the poverty level •The clinic cannot treat you if you are pregnant or think you are pregnant • Sexually transmitted diseases are not treated at the clinic.  ° ° °Dental Care: °Organization         Address  Phone  Notes  °[place] [facility] [address], [place] (336) 641-6152 Accepts children up to age 21 who are enrolled in Medicaid or [place] Health Choice; pregnant women with a Medicaid card; and children who have applied for Medicaid or [place] Health Choice, but were declined, whose parents can pay a reduced fee at time of service.  °[place]  [facility] [place]  [address], [place] (336) 641-7733 Accepts children up to age 21 who are enrolled in Medicaid or [place] Health Choice; pregnant women with a Medicaid card; and children who have applied for Medicaid or [place] Health Choice, but were declined, whose parents can pay a reduced fee at time of service.  °[facility] ° [address], [place] (336) 641-4533 Patients are seen by appointment only. Walk-ins are not accepted. [facility] will see patients 18 years of age and older. °Monday - Tuesday (8am-5pm) °Most Wednesdays (8:30-5pm) °$30 per visit, cash only  °[facility] ° [address], [place] (336) 641-4533 Patients are seen by appointment only. Walk-ins are not accepted. [facility] will see patients 18 years of age and older. °One   Wednesday Evening (Monthly: Volunteer Based).  $30 per visit, cash only  °[facility]  (919) [phone] for adults; Children under age 4, call Graduate Pediatric Dentistry at ([phone]) [phone]. Children aged 4-14, please call ([phone]) [phone] to request a pediatric application. ° Dental services are provided in all areas of dental care including fillings, crowns and bridges, complete and partial dentures, implants, gum treatment, root canals, and extractions. Preventive care is also provided. Treatment is provided to both adults and children. °Patients are selected via a lottery and there is often a waiting list. °  °[facility] [address], °[place] ° (336) 763-8833 www.drcivils.com °  °[facility] [address], [place], [place] (336)723-1848, Ext. 123 Second and Fourth Thursday of each month, opens at 6:30 AM; Clinic ends at 9 AM.  Patients are seen on a first-come first-served basis, and a limited number are seen during each clinic.  ° °[facility] ° [address], [place], [place] (336) [phone]    Eligibility Requirements °You must have lived in [place], [place], or [place] counties for at least the last three months. °  You cannot be eligible for state or federal sponsored healthcare insurance, including Veterans Administration, Medicaid, or Medicare. °  You generally cannot be eligible for healthcare insurance through your employer.  °  How to apply: °Eligibility screenings are held every Tuesday and Wednesday afternoon from 1:00 pm until 4:00 pm. You do not need an appointment for the interview!  °[facility] [address], [place], [place] 336-631-2330   °[facility]  336-342-8273   °[place]  336-703-3100   ° [facility]  336-570-6415   ° °Behavioral Health Resources in the Community: °Intensive Outpatient Programs °Organization         Address  Phone  Notes  °[facility] [address], [place], [place] 336-878-6098   °[facility] [address], [place], [place] 336-832-9800   °[facility] [address], [place], [place] ° 336-882-2125   °[facility] [address],  °[place], [place] 1-800-853-5163 or 336-641-4981   °Substance Abuse Resources °Organization         Address  Phone  Notes  °[facility]  336-882-2125   °[facility]  336-784-9470   °[facility]  336-285-9073   °[facility]  336-845-3988   °Residential & Outpatient Substance Abuse Program  1-800-659-3381   °Psychological Services °Organization         Address  Phone  Notes  °[facility]  336- 832-9600   °[facility]  336- 378-7881   °[facility] [address], [place] 1-800-853-5163 or 336-641-4981   ° °Mobile Crisis Teams °Organization         Address  Phone  Notes  °[facility], Mobile Crisis Care Unit  1-877-626-1772   °Assertive °[facility] ° [address]  [place], [place] 336-834-9664   °Sharon DeEsch [address] °[place] 336-554-5454   ° °Self-Help/Support Groups °Organization         Address  Phone             Notes  °[facility] - variety of support groups  336- 373-1402 Call for more information  °Narcotics Anonymous (NA), [facility] [address], °[place]  2 meetings at this location  ° °  Residential Treatment Programs Organization         Address  Phone  Notes  [facility] [address],    [place]  1-747-857-1803   [facility]  [address], [place] [phone], [place], [place]   [place], [place] 412-055-7575 Admissions: 8am-3pm M-F  [facility] [place] [address],    [place], [place] 195-093-2671   [facility] [address] [place], [place], [place]   [facility] [address],  [place], [place]   [facility] Intensive Outpatient [address], [place], [place]   [facility] [address] [place]  [place], [place] 1-971-606-5122 or 660-695-6067   [facility] [address] [place], [place] Accepts Medicaid  [facility] [address],  [place] 1-401-688-7462 Substance Abuse/Addiction Treatment   [facility]         Address  Phone  Notes  [facility]  430-733-5762   Domenic Schwab, PhD [address] Arlis Porta [place], [place]   8060874538 or (304)722-4852   [facility] [place], [place] (218)767-8287   [facility] [address], [place], [place] [phone] Insurance/Medicaid/sponsorship through [facility] and Families [address]                                    [place], [place] [phone] [place] [address], [place] ([phone]    Dr. Adele Schilder  ([phone]   [facility] Dept. 1) [address], [place] 2) [place] 3)  [address], [place] [phone]  [phone]   [place] (740) [phone] or 704-534-1732 (After Hours)       Chest Pain Observation It is often hard to give a specific diagnosis for the cause of chest pain. Among other possibilities your symptoms might be caused by inadequate oxygen delivery to your heart (angina). Angina that is not treated or evaluated can lead to a heart attack (myocardial infarction) or death. Blood tests, electrocardiograms, and X-rays may have been done to help determine a possible cause of your chest pain. After evaluation and observation, your health care provider has determined that it is unlikely your pain was caused by an unstable condition that requires hospitalization. However, a full evaluation of your pain may need to be completed, with additional diagnostic testing as directed. It is very important to keep your follow-up appointments. Not keeping your follow-up appointments could result in permanent heart damage, disability, or death. If there is any problem keeping your follow-up appointments, you must call your health care provider. HOME CARE INSTRUCTIONS  Due to the slight chance that your pain could be angina, it is important to follow your health care provider's treatment plan and also maintain a healthy lifestyle:  Maintain or work toward achieving a healthy weight.  Stay physically active and exercise regularly.  Decrease your salt intake.  Eat a balanced, healthy diet. Talk to a dietitian to learn about heart-healthy foods.  Increase your fiber intake by including whole grains, vegetables, fruits, and  nuts in your diet.  Avoid situations that cause stress, anger,  or depression.  Take medicines as advised by your health care provider. Report any side effects to your health care provider. Do not stop medicines or adjust the dosages on your own.  Quit smoking. Do not use nicotine patches or gum until you check with your health care provider.  Keep your blood pressure, blood sugar, and cholesterol levels within normal limits.  Limit alcohol intake to no more than 1 drink per day for women who are not pregnant and 2 drinks per day for men.  Do not abuse drugs. SEEK IMMEDIATE MEDICAL CARE IF: You have severe chest pain or pressure which may include symptoms such as:  You feel pain or pressure in your arms, neck, jaw, or back.  You have severe back or abdominal pain, feel sick to your stomach (nauseous), or throw up (vomit).  You are sweating profusely.  You are having a fast or irregular heartbeat.  You feel short of breath while at rest.  You notice increasing shortness of breath during rest, sleep, or with activity.  You have chest pain that does not get better after rest or after taking your usual medicine.  You wake from sleep with chest pain.  You are unable to sleep because you cannot breathe.  You develop a frequent cough or you are coughing up blood.  You feel dizzy, faint, or experience extreme fatigue.  You develop severe weakness, dizziness, fainting, or chills. Any of these symptoms may represent a serious problem that is an emergency. Do not wait to see if the symptoms will go away. Call your local emergency services (911 in the [place].S[place] Do not drive yourself to the hospital. MAKE SURE YOU:  Understand these instructions.  Will watch your condition.  Will get help right away if you are not doing well or get worse. Document Released: 03/07/2010 Document Revised: 02/07/2013 Document Reviewed: 08/04/2012 [facility] Patient Information 2015 [place], [place]. This information is not intended to replace advice given to you by your  health care provider. Make sure you discuss any questions you have with your health care provider.  Depression, Adult Depression refers to feeling sad, low, down in the dumps, blue, gloomy, or empty. In general, there are two kinds of depression: 1. Depression that we all experience from time to time because of upsetting life experiences, including the loss of a job or the ending of a relationship (normal sadness or normal grief). This kind of depression is considered normal, is short lived, and resolves within a few days to 2 weeks. (Depression experienced after the loss of a loved one is called bereavement. Bereavement often lasts longer than 2 weeks but normally gets better with time.) 2. Clinical depression, which lasts longer than normal sadness or normal grief or interferes with your ability to function at home, at work, and in school. It also interferes with your personal relationships. It affects almost every aspect of your life. Clinical depression is an illness. Symptoms of depression also can be caused by conditions other than normal sadness and grief or clinical depression. Examples of these conditions are listed as follows:  Physical illness--Some physical illnesses, including underactive thyroid gland (hypothyroidism), severe anemia, specific types of cancer, diabetes, uncontrolled seizures, heart and lung problems, strokes, and chronic pain are commonly associated with symptoms of depression.  Side effects of some prescription medicine--In some people, certain types of prescription medicine can cause symptoms of depression.  Substance abuse--Abuse of alcohol and  illicit drugs can cause symptoms of depression. SYMPTOMS Symptoms of normal sadness and normal grief include the following:  Feeling sad or crying for short periods of time.  Not caring about anything (apathy).  Difficulty sleeping or sleeping too much.  No longer able to enjoy the things you used to enjoy.  Desire to  be by oneself all the time (social isolation).  Lack of energy or motivation.  Difficulty concentrating or remembering.  Change in appetite or weight.  Restlessness or agitation. Symptoms of clinical depression include the same symptoms of normal sadness or normal grief and also the following symptoms:  Feeling sad or crying all the time.  Feelings of guilt or worthlessness.  Feelings of hopelessness or helplessness.  Thoughts of suicide or the desire to harm yourself (suicidal ideation).  Loss of touch with reality (psychotic symptoms). Seeing or hearing things that are not real (hallucinations) or having false beliefs about your life or the people around you (delusions and paranoia). DIAGNOSIS  The diagnosis of clinical depression usually is based on the severity and duration of the symptoms. Your caregiver also will ask you questions about your medical history and substance use to find out if physical illness, use of prescription medicine, or substance abuse is causing your depression. Your caregiver also may order blood tests. TREATMENT  Typically, normal sadness and normal grief do not require treatment. However, sometimes antidepressant medicine is prescribed for bereavement to ease the depressive symptoms until they resolve. The treatment for clinical depression depends on the severity of your symptoms but typically includes antidepressant medicine, counseling with a mental health professional, or a combination of both. Your caregiver will help to determine what treatment is best for you. Depression caused by physical illness usually goes away with appropriate medical treatment of the illness. If prescription medicine is causing depression, talk with your caregiver about stopping the medicine, decreasing the dose, or substituting another medicine. Depression caused by abuse of alcohol or illicit drugs abuse goes away with abstinence from these substances. Some adults need  professional help in order to stop drinking or using drugs. SEEK IMMEDIATE CARE IF:  You have thoughts about hurting yourself or others.  You lose touch with reality (have psychotic symptoms).  You are taking medicine for depression and have a serious side effect. FOR MORE INFORMATION National Alliance on Mental Illness: www.nami.[employer] of Mental Health: https://carter.com/ Document Released: 01/31/2000 Document Revised: 08/04/2011 Document Reviewed: 05/04/2011 [facility] Patient Information 2015 [place], [place]. This information is not intended to replace advice given to you by your health care provider. Make sure you discuss any questions you have with your health care provider.  Fatigue Fatigue is a feeling of tiredness, lack of energy, lack of motivation, or feeling tired all the time. Having enough rest, good nutrition, and reducing stress will normally reduce fatigue. Consult your caregiver if it persists. The nature of your fatigue will help your caregiver to find out its cause. The treatment is based on the cause.  CAUSES  There are many causes for fatigue. Most of the time, fatigue can be traced to one or more of your habits or routines. Most causes fit into one or more of three general areas. They are: Lifestyle problems  Sleep disturbances.  Overwork.  Physical exertion.  Unhealthy habits.  Poor eating habits or eating disorders.  Alcohol and/or drug use .  Lack of proper nutrition (malnutrition). Psychological problems  Stress and/or anxiety problems.  Depression.  Grief.  Boredom. Medical Problems or  Conditions  Anemia.  Pregnancy.  Thyroid gland problems.  Recovery from major surgery.  Continuous pain.  Emphysema or asthma that is not well controlled  Allergic conditions.  Diabetes.  Infections (such as mononucleosis).  Obesity.  Sleep disorders, such as sleep apnea.  Heart failure or other heart-related  problems.  Cancer.  Kidney disease.  Liver disease.  Effects of certain medicines such as antihistamines, cough and cold remedies, prescription pain medicines, heart and blood pressure medicines, drugs used for treatment of cancer, and some antidepressants. SYMPTOMS  The symptoms of fatigue include:   Lack of energy.  Lack of drive (motivation).  Drowsiness.  Feeling of indifference to the surroundings. DIAGNOSIS  The details of how you feel help guide your caregiver in finding out what is causing the fatigue. You will be asked about your present and past health condition. It is important to review all medicines that you take, including prescription and non-prescription items. A thorough exam will be done. You will be questioned about your feelings, habits, and normal lifestyle. Your caregiver may suggest blood tests, urine tests, or other tests to look for common medical causes of fatigue.  TREATMENT  Fatigue is treated by correcting the underlying cause. For example, if you have continuous pain or depression, treating these causes will improve how you feel. Similarly, adjusting the dose of certain medicines will help in reducing fatigue.  HOME CARE INSTRUCTIONS   Try to get the required amount of good sleep every night.  Eat a healthy and nutritious diet, and drink enough water throughout the day.  Practice ways of relaxing (including yoga or meditation).  Exercise regularly.  Make plans to change situations that cause stress. Act on those plans so that stresses decrease over time. Keep your work and personal routine reasonable.  Avoid street drugs and minimize use of alcohol.  Start taking a daily multivitamin after consulting your caregiver. SEEK MEDICAL CARE IF:   You have persistent tiredness, which cannot be accounted for.  You have fever.  You have unintentional weight loss.  You have headaches.  You have disturbed sleep throughout the night.  You are feeling  sad.  You have constipation.  You have dry skin.  You have gained weight.  You are taking any new or different medicines that you suspect are causing fatigue.  You are unable to sleep at night.  You develop any unusual swelling of your legs or other parts of your body. SEEK IMMEDIATE MEDICAL CARE IF:   You are feeling confused.  Your vision is blurred.  You feel faint or pass out.  You develop severe headache.  You develop severe abdominal, pelvic, or back pain.  You develop chest pain, shortness of breath, or an irregular or fast heartbeat.  You are unable to pass a normal amount of urine.  You develop abnormal bleeding such as bleeding from the rectum or you vomit blood.  You have thoughts about harming yourself or committing suicide.  You are worried that you might harm someone else. MAKE SURE YOU:   Understand these instructions.  Will watch your condition.  Will get help right away if you are not doing well or get worse. Document Released: 11/30/2006 Document Revised: 04/27/2011 Document Reviewed: 11/30/2006 [facility] Patient Information 2015 [place], [place]. This information is not intended to replace advice given to you by your health care provider. Make sure you discuss any questions you have with your health care provider.  Smoking Cessation, Tips for Success If you are ready  to quit smoking, congratulations! You have chosen to help yourself be healthier. Cigarettes bring nicotine, tar, carbon monoxide, and other irritants into your body. Your lungs, heart, and blood vessels will be able to work better without these poisons. There are many different ways to quit smoking. Nicotine gum, nicotine patches, a nicotine inhaler, or nicotine nasal spray can help with physical craving. Hypnosis, support groups, and medicines help break the habit of smoking. WHAT THINGS CAN I DO TO MAKE QUITTING EASIER?  Here are some tips to help you quit for good:  Pick a date when  you will quit smoking completely. Tell all of your friends and family about your plan to quit on that date.  Do not try to slowly cut down on the number of cigarettes you are smoking. Pick a quit date and quit smoking completely starting on that day.  Throw away all cigarettes.   Clean and remove all ashtrays from your home, work, and car.   On a card, write down your reasons for quitting. Carry the card with you and read it when you get the urge to smoke.   Cleanse your body of nicotine. Drink enough water and fluids to keep your urine clear or pale yellow. Do this after quitting to flush the nicotine from your body.   Learn to predict your moods. Do not let a bad situation be your excuse to have a cigarette. Some situations in your life might tempt you into wanting a cigarette.   Never have "just one" cigarette. It leads to wanting another and another. Remind yourself of your decision to quit.   Change habits associated with smoking. If you smoked while driving or when feeling stressed, try other activities to replace smoking. Stand up when drinking your coffee. Brush your teeth after eating. Sit in a different chair when you read the paper. Avoid alcohol while trying to quit, and try to drink fewer caffeinated beverages. Alcohol and caffeine may urge you to smoke.   Avoid foods and drinks that can trigger a desire to smoke, such as sugary or spicy foods and alcohol.   Ask people who smoke not to smoke around you.   Have something planned to do right after eating or having a cup of coffee. For example, plan to take a walk or exercise.   Try a relaxation exercise to calm you down and decrease your stress. Remember, you may be tense and nervous for the first 2 weeks after you quit, but this will pass.   Find new activities to keep your hands busy. Play with a pen, coin, or rubber band. Doodle or draw things on paper.   Brush your teeth right after eating. This will help cut  down on the craving for the taste of tobacco after meals. You can also try mouthwash.   Use oral substitutes in place of cigarettes. Try using lemon drops, carrots, cinnamon sticks, or chewing gum. Keep them handy so they are available when you have the urge to smoke.   When you have the urge to smoke, try deep breathing.   Designate your home as a nonsmoking area.   If you are a heavy smoker, ask your health care provider about a prescription for nicotine chewing gum. It can ease your withdrawal from nicotine.   Reward yourself. Set aside the cigarette money you save and buy yourself something nice.   Look for support from others. Join a support group or smoking cessation program. Ask someone at home or  at work to help you with your plan to quit smoking.   Always ask yourself, "Do I need this cigarette or is this just a reflex?" Tell yourself, "Today, I choose not to smoke," or "I do not want to smoke." You are reminding yourself of your decision to quit.  Do not replace cigarette smoking with electronic cigarettes (commonly called e-cigarettes). The safety of e-cigarettes is unknown, and some may contain harmful chemicals.  If you relapse, do not give up! Plan ahead and think about what you will do the next time you get the urge to smoke.  HOW WILL I FEEL WHEN I QUIT SMOKING? You may have symptoms of withdrawal because your body is used to nicotine (the addictive substance in cigarettes). You may crave cigarettes, be irritable, feel very hungry, cough often, get headaches, or have difficulty concentrating. The withdrawal symptoms are only temporary. They are strongest when you first quit but will go away within 10-14 days. When withdrawal symptoms occur, stay in control. Think about your reasons for quitting. Remind yourself that these are signs that your body is healing and getting used to being without cigarettes. Remember that withdrawal symptoms are easier to treat than the major  diseases that smoking can cause.  Even after the withdrawal is over, expect periodic urges to smoke. However, these cravings are generally short lived and will go away whether you smoke or not. Do not smoke!  WHAT RESOURCES ARE AVAILABLE TO HELP ME QUIT SMOKING? Your health care provider can direct you to community resources or hospitals for support, which may include:  Group support.  Education.  Hypnosis.  Therapy. Document Released: 11/01/2003 Document Revised: 11/23/2012 Document Reviewed: 07/21/2012 [facility] Patient Information 2015 [place], [place]. This information is not intended to replace advice given to you by your health care provider. Make sure you discuss any questions you have with your health care provider.  Smoking Hazards Smoking cigarettes is extremely bad for your health. Tobacco smoke has over 200 known poisons in it. It contains the poisonous gases nitrogen oxide and carbon monoxide. There are over 60 chemicals in tobacco smoke that cause cancer. Some of the chemicals found in cigarette smoke include:   Cyanide.   Benzene.   Formaldehyde.   Methanol (wood alcohol).   Acetylene (fuel used in welding torches).   Ammonia.  Even smoking lightly shortens your life expectancy by several years. You can greatly reduce the risk of medical problems for you and your family by stopping now. Smoking is the most preventable cause of death and disease in our society. Within days of quitting smoking, your circulation improves, you decrease the risk of having a heart attack, and your lung capacity improves. There may be some increased phlegm in the first few days after quitting, and it may take months for your lungs to clear up completely. Quitting for 10 years reduces your risk of developing lung cancer to almost that of a nonsmoker.  WHAT ARE THE RISKS OF SMOKING? Cigarette smokers have an increased risk of many serious medical problems, including:  Lung cancer.   Lung  disease (such as pneumonia, bronchitis, and emphysema).   Heart attack and chest pain due to the heart not getting enough oxygen (angina).   Heart disease and peripheral blood vessel disease.   Hypertension.   Stroke.   Oral cancer (cancer of the lip, mouth, or voice box).   Bladder cancer.   Pancreatic cancer.   Cervical cancer.   Pregnancy complications, including premature birth.  Stillbirths and smaller newborn babies, birth defects, and genetic damage to sperm.   Early menopause.   Lower estrogen level for women.   Infertility.   Facial wrinkles.   Blindness.   Increased risk of broken bones (fractures).   Senile dementia.   Stomach ulcers and internal bleeding.   Delayed wound healing and increased risk of complications during surgery. Because of secondhand smoke exposure, children of smokers have an increased risk of the following:   Sudden infant death syndrome (SIDS).   Respiratory infections.   Lung cancer.   Heart disease.   Ear infections.  WHY IS SMOKING ADDICTIVE? Nicotine is the chemical agent in tobacco that is capable of causing addiction or dependence. When you smoke and inhale, nicotine is absorbed rapidly into the bloodstream through your lungs. Both inhaled and noninhaled nicotine may be addictive.  WHAT ARE THE BENEFITS OF QUITTING?  There are many health benefits to quitting smoking. Some are:   The likelihood of developing cancer and heart disease decreases. Health improvements are seen almost immediately.   Blood pressure, pulse rate, and breathing patterns start returning to normal soon after quitting.   People who quit may see an improvement in their overall quality of life.  HOW DO YOU QUIT SMOKING? Smoking is an addiction with both physical and psychological effects, and longtime habits can be hard to change. Your health care provider can recommend:  Programs and community resources, which may  include group support, education, or therapy.  Replacement products, such as patches, gum, and nasal sprays. Use these products only as directed. Do not replace cigarette smoking with electronic cigarettes (commonly called e-cigarettes). The safety of e-cigarettes is unknown, and some may contain harmful chemicals. FOR MORE INFORMATION  American Lung Association: www.lung.org  American Cancer Society: www.cancer.org Document Released: 03/12/2004 Document Revised: 11/23/2012 Document Reviewed: 07/25/2012 [facility] Patient Information 2015 [place], [place]. This information is not intended to replace advice given to you by your health care provider. Make sure you discuss any questions you have with your health care provider.

## 2013-09-13 ENCOUNTER — Encounter: Payer: Self-pay | Admitting: Nurse Practitioner

## 2013-09-13 ENCOUNTER — Ambulatory Visit (INDEPENDENT_AMBULATORY_CARE_PROVIDER_SITE_OTHER): Payer: Medicare Other | Admitting: Nurse Practitioner

## 2013-09-13 VITALS — BP 130/83 | HR 96 | Temp 98.0°F | Ht 68.0 in | Wt 147.8 lb

## 2013-09-13 DIAGNOSIS — M419 Scoliosis, unspecified: Secondary | ICD-10-CM | POA: Insufficient documentation

## 2013-09-13 DIAGNOSIS — R4589 Other symptoms and signs involving emotional state: Secondary | ICD-10-CM

## 2013-09-13 DIAGNOSIS — F411 Generalized anxiety disorder: Secondary | ICD-10-CM

## 2013-09-13 DIAGNOSIS — K219 Gastro-esophageal reflux disease without esophagitis: Secondary | ICD-10-CM

## 2013-09-13 DIAGNOSIS — R197 Diarrhea, unspecified: Secondary | ICD-10-CM

## 2013-09-13 DIAGNOSIS — K529 Noninfective gastroenteritis and colitis, unspecified: Secondary | ICD-10-CM | POA: Insufficient documentation

## 2013-09-13 DIAGNOSIS — R5381 Other malaise: Secondary | ICD-10-CM | POA: Insufficient documentation

## 2013-09-13 DIAGNOSIS — R079 Chest pain, unspecified: Secondary | ICD-10-CM

## 2013-09-13 DIAGNOSIS — M4120 Other idiopathic scoliosis, site unspecified: Secondary | ICD-10-CM

## 2013-09-13 DIAGNOSIS — J449 Chronic obstructive pulmonary disease, unspecified: Secondary | ICD-10-CM

## 2013-09-13 DIAGNOSIS — R634 Abnormal weight loss: Secondary | ICD-10-CM

## 2013-09-13 DIAGNOSIS — R5383 Other fatigue: Secondary | ICD-10-CM

## 2013-09-13 MED ORDER — FLUOXETINE HCL 20 MG PO TABS: ORAL_TABLET | ORAL | Status: DC

## 2013-09-13 MED ORDER — CULTURELLE DIGESTIVE HEALTH PO CAPS: 1.0000 | ORAL_CAPSULE | Freq: Every day | ORAL | Status: DC

## 2013-09-13 MED ORDER — ASPIRIN 81 MG PO CHEW: 81.0000 mg | CHEWABLE_TABLET | Freq: Every day | ORAL | Status: DC

## 2013-09-13 MED ORDER — LORAZEPAM 0.5 MG PO TABS: 0.5000 mg | ORAL_TABLET | Freq: Two times a day (BID) | ORAL | Status: DC | PRN

## 2013-09-13 NOTE — Assessment & Plan Note (Addendum)
Pt reports 25 lb weight loss in last 4 mos, since stopped narcotic pain meds. Extreme fatigue also, chronic diarrhea-2/daily. DD: thyroid disease, malabsorption, depression, ca Labs today.

## 2013-09-13 NOTE — Assessment & Plan Note (Signed)
Diarrhea, weight loss, Hx thyroid cyst. JS:RPRXYVO disease, depression, anemia, heart disease Labs today.

## 2013-09-13 NOTE — Assessment & Plan Note (Signed)
ER eval 1 mo. Ago. ECG showed ST elevation, but no changes. No pain at rest. Risk factors: Fam Hx of non-compaction cardiomyopathy, smoker, sedentary life-style 81 mg ASA EC qd Ref to cardio. For eval & treat.
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
## 2013-09-13 NOTE — Progress Notes (Signed)
Pre visit review using our clinic review tool, if applicable. No additional management support is needed unless otherwise documented below in the visit note. 

## 2013-09-13 NOTE — Assessment & Plan Note (Signed)
Start fluoxetine qhs. Ativan PRN. F/u 4 weeks.

## 2013-09-13 NOTE — Patient Instructions (Signed)
My office will call with lab results.  Start fluoxetine for anxiety. Take at night as directed. Use ativan as needed for insomnia & anxiety.  Please see cardiology.  Always take ibuprophen with food. Take 1000 mg tylenol with 600 mg ibuprophen twice daily for pain.  Start probiotic daily (culturelle) to help with diarrhea.  Start 81 mg enteric coated aspirin daily.   See you in 4 weeks.

## 2013-09-14 ENCOUNTER — Other Ambulatory Visit: Payer: Self-pay | Admitting: *Deleted

## 2013-09-14 ENCOUNTER — Encounter: Payer: Self-pay | Admitting: *Deleted

## 2013-09-14 ENCOUNTER — Other Ambulatory Visit: Payer: Self-pay | Admitting: Nurse Practitioner

## 2013-09-14 ENCOUNTER — Telehealth: Payer: Self-pay | Admitting: *Deleted

## 2013-09-14 DIAGNOSIS — R079 Chest pain, unspecified: Secondary | ICD-10-CM

## 2013-09-14 LAB — COMPREHENSIVE METABOLIC PANEL
ALK PHOS: 91 U/L (ref 39–117)
ALT: 16 U/L (ref 0–53)
AST: 29 U/L (ref 0–37)
Albumin: 4.4 g/dL (ref 3.5–5.2)
BILIRUBIN TOTAL: 0.6 mg/dL (ref 0.2–1.2)
BUN: 8 mg/dL (ref 6–23)
CO2: 28 mEq/L (ref 19–32)
Calcium: 9.8 mg/dL (ref 8.4–10.5)
Chloride: 104 mEq/L (ref 96–112)
Creatinine, Ser: 1.1 mg/dL (ref 0.4–1.5)
GFR: 76.65 mL/min (ref >60.00)
Glucose, Bld: 88 mg/dL (ref 70–99)
Potassium: 4.9 mEq/L (ref 3.5–5.1)
Sodium: 140 mEq/L (ref 135–145)
Total Protein: 6.7 g/dL (ref 6.0–8.3)

## 2013-09-14 LAB — LIPID PANEL
CHOL/HDL RATIO: 5
Cholesterol: 213 mg/dL — ABNORMAL HIGH (ref 0–200)
HDL: 45 mg/dL (ref >39.00)
LDL Cholesterol: 135 mg/dL — ABNORMAL HIGH (ref 0–99)
NONHDL: 168
Triglycerides: 165 mg/dL — ABNORMAL HIGH (ref 0.0–149.0)
VLDL: 33 mg/dL (ref 0.0–40.0)

## 2013-09-14 LAB — CBC WITH DIFFERENTIAL/PLATELET
HEMATOCRIT: 46.9 % (ref 39.0–52.0)
Hemoglobin: 15.9 g/dL (ref 13.0–17.0)
MCHC: 33.9 g/dL (ref 30.0–36.0)
MCV: 98 fl (ref 78.0–100.0)
Platelets: 190 10*3/uL (ref 150.0–400.0)
RBC: 4.79 Mil/uL (ref 4.22–5.81)
RDW: 13.4 % (ref 11.5–15.5)
WBC: 10.7 10*3/uL — AB (ref 4.0–10.5)

## 2013-09-14 LAB — URINALYSIS, ROUTINE W REFLEX MICROSCOPIC
BILIRUBIN URINE: NEGATIVE
Ketones, ur: NEGATIVE
Leukocytes, UA: NEGATIVE
NITRITE: NEGATIVE
PH: 6 (ref 5.0–8.0)
Total Protein, Urine: NEGATIVE
Urine Glucose: NEGATIVE
Urobilinogen, UA: 0.2 (ref 0.0–1.0)

## 2013-09-14 LAB — THYROID PEROXIDASE ANTIBODY: Thyroperoxidase Ab SerPl-aCnc: <10 IU/mL (ref <35.0)

## 2013-09-14 LAB — MAGNESIUM: Magnesium: 2 mg/dL (ref 1.5–2.5)

## 2013-09-14 MED ORDER — ASPIRIN 81 MG PO CHEW: 81.0000 mg | CHEWABLE_TABLET | Freq: Every day | ORAL | Status: DC

## 2013-09-14 NOTE — Telephone Encounter (Signed)
Patient called office about his prozac being to expensive $90. Patient wanted to know if there is something else he can try?

## 2013-09-14 NOTE — Telephone Encounter (Signed)
I am not aware of a cheaper drug. Check with outpatient pharmacy to see if cheaper. If not, He may want to call other pharmacies like [facility] or [facility] or mail order to see if he can get it cheaper. Perhaps [facility] patient assistance can help.

## 2013-09-15 ENCOUNTER — Ambulatory Visit: Payer: Medicare Other | Admitting: Cardiovascular Disease

## 2013-09-15 ENCOUNTER — Other Ambulatory Visit: Payer: Self-pay | Admitting: Nurse Practitioner

## 2013-09-15 DIAGNOSIS — N411 Chronic prostatitis: Secondary | ICD-10-CM

## 2013-09-15 DIAGNOSIS — F3289 Other specified depressive episodes: Secondary | ICD-10-CM

## 2013-09-15 DIAGNOSIS — F329 Major depressive disorder, single episode, unspecified: Secondary | ICD-10-CM

## 2013-09-15 LAB — TSH: TSH: 1.64 u[IU]/mL (ref 0.35–4.50)

## 2013-09-15 LAB — VITAMIN B12: VITAMIN B 12: 713 pg/mL (ref 211–911)

## 2013-09-15 LAB — VITAMIN D 25 HYDROXY (VIT D DEFICIENCY, FRACTURES): VITD: 27.83 ng/mL — AB (ref 30.00–100.00)

## 2013-09-15 LAB — T4, FREE: FREE T4: 0.75 ng/dL (ref 0.60–1.60)

## 2013-09-15 MED ORDER — CIPROFLOXACIN HCL 500 MG PO TABS: 500.0000 mg | ORAL_TABLET | Freq: Two times a day (BID) | ORAL | Status: DC

## 2013-09-15 MED ORDER — FLUOXETINE HCL 10 MG PO CAPS: ORAL_CAPSULE | ORAL | Status: DC

## 2013-09-15 MED ORDER — TAMSULOSIN HCL 0.4 MG PO CAPS: 0.4000 mg | ORAL_CAPSULE | Freq: Every day | ORAL | Status: DC

## 2013-09-15 NOTE — Telephone Encounter (Signed)
LMOVM for pt to resturn call.

## 2013-09-15 NOTE — Telephone Encounter (Signed)
pls call pt: Advise Microscopic blood & bacteria in urine. He should contact urologist for appt. Or I will refer if he wants. If he is having symptoms I will start antibiotic, but I want him to f/u urology-they have treated him before.  Thyroid nml, kidney & liver func good. I want to discuss starting cholesterol medicine at return visit. Vitamin D too low. Start D3 2000 daily with lunch or dinner, may help with mood. B12 & magnesium nml.  Ask if able to get antidepressant filled.

## 2013-09-15 NOTE — Telephone Encounter (Signed)
Patient returned call and was notified about Prozac. Patient was also given results. Patient stated that he has not begun the antidepressant yet however he will try to pick up rx today and start taking it. Patient stated that he will make appt with his urologist. Pt has been feeling urine urgency and frequency. Patient has more frequency at night where he has a hard time emptying his bladder. He feels like he has to urinate again as soon as he's finished.

## 2013-09-19 ENCOUNTER — Encounter: Payer: Self-pay | Admitting: Cardiology

## 2013-09-19 ENCOUNTER — Ambulatory Visit (INDEPENDENT_AMBULATORY_CARE_PROVIDER_SITE_OTHER): Payer: Medicare Other | Admitting: Cardiology

## 2013-09-19 VITALS — BP 124/85 | HR 97 | Ht 68.0 in | Wt 148.0 lb

## 2013-09-19 DIAGNOSIS — R002 Palpitations: Secondary | ICD-10-CM

## 2013-09-19 DIAGNOSIS — R079 Chest pain, unspecified: Secondary | ICD-10-CM
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
## 2013-09-19 NOTE — Patient Instructions (Signed)
Your physician has requested that you have an echocardiogram with contrast. Echocardiography is a painless test that uses sound waves to create images of your heart. It provides your doctor with information about the size and shape of your heart and how well your heart's chambers and valves are working. This procedure takes approximately one hour. There are no restrictions for this procedure. Office will contact with results via phone or letter.   Continue all current medications. Follow up pending test results

## 2013-09-22 ENCOUNTER — Ambulatory Visit (HOSPITAL_COMMUNITY): Payer: Medicare Other

## 2013-09-22 ENCOUNTER — Telehealth: Payer: Self-pay | Admitting: *Deleted

## 2013-09-22 NOTE — Telephone Encounter (Signed)
Ask him if he wants to see spine specialist before pain management.

## 2013-09-22 NOTE — Telephone Encounter (Signed)
Patient called office requesting referral to Pain Clinic. Patient stated that the pain in his neck is unbearable. Please advise? Patient has no preference.

## 2013-09-25 ENCOUNTER — Other Ambulatory Visit: Payer: Self-pay | Admitting: Nurse Practitioner

## 2013-09-25 DIAGNOSIS — M545 Low back pain, unspecified: Secondary | ICD-10-CM

## 2013-09-25 NOTE — Telephone Encounter (Signed)
LMOVM for pt to return call 

## 2013-09-25 NOTE — Telephone Encounter (Signed)
Patient stated that he prefers to see pain management. Patient stated that he has seen spine specialists in the past and they never helped him.

## 2013-09-26 NOTE — Progress Notes (Signed)
Left detailed message on pt's cell vm. Per pt's DPR. 

## 2013-09-27 ENCOUNTER — Ambulatory Visit (HOSPITAL_COMMUNITY)
Admission: RE | Admit: 2013-09-27 | Discharge: 2013-09-27 | Disposition: A | Discharge status: Home / Self Care | Payer: Medicare Other | Source: Ambulatory Visit | Attending: Cardiovascular Disease | Admitting: Cardiovascular Disease

## 2013-09-27 DIAGNOSIS — R079 Chest pain, unspecified: Secondary | ICD-10-CM | POA: Diagnosis not present

## 2013-09-27 DIAGNOSIS — F17200 Nicotine dependence, unspecified, uncomplicated: Secondary | ICD-10-CM | POA: Insufficient documentation

## 2013-09-27 DIAGNOSIS — E785 Hyperlipidemia, unspecified: Secondary | ICD-10-CM | POA: Diagnosis not present

## 2013-09-27 DIAGNOSIS — R002 Palpitations: Secondary | ICD-10-CM | POA: Insufficient documentation

## 2013-09-27 DIAGNOSIS — I369 Nonrheumatic tricuspid valve disorder, unspecified: Secondary | ICD-10-CM

## 2013-09-27 DIAGNOSIS — I079 Rheumatic tricuspid valve disease, unspecified: Secondary | ICD-10-CM | POA: Diagnosis not present

## 2013-09-27 DIAGNOSIS — J449 Chronic obstructive pulmonary disease, unspecified: Secondary | ICD-10-CM | POA: Insufficient documentation

## 2013-09-27 DIAGNOSIS — J4489 Other specified chronic obstructive pulmonary disease: Secondary | ICD-10-CM | POA: Insufficient documentation

## 2013-09-27 NOTE — Progress Notes (Signed)
  Echocardiogram 2D Echocardiogram has been performed.  Odon, Thayne 09/27/2013, 4:47 PM

## 2013-10-02 ENCOUNTER — Telehealth: Payer: Self-pay | Admitting: Cardiology

## 2013-10-02 NOTE — Telephone Encounter (Signed)
Jeffrey Mendez is calling wanting test reults.

## 2013-10-02 NOTE — Telephone Encounter (Signed)
Patient notified results are on Dr. Harl Bowie desk top.  Will notify patient as soon as possible.

## 2013-10-04 ENCOUNTER — Telehealth: Payer: Self-pay | Admitting: *Deleted

## 2013-10-04 NOTE — Telephone Encounter (Signed)
Patient called office and left vm requesting a call back concerning two of his medications.

## 2013-10-04 NOTE — Telephone Encounter (Signed)
Returned pt's call. Patient is requesting rx for pain med. Patient stated that he has been in a lot of pain for last week with his back. Patient stated that he has been unable to sleep. Pt has stopped taking the prozac because he did not like the way the med made him feel, dizzy and off balance. Patient has been to pain management but has not been treated for pain yet. Pain Management is currently reviewing his office notes before seeing him and was told that it will be October before an appt is available. Patient is requesting pain med until he is able to see pain management. Please advise?

## 2013-10-05 ENCOUNTER — Other Ambulatory Visit: Payer: Self-pay | Admitting: Nurse Practitioner

## 2013-10-05 DIAGNOSIS — IMO0001 Reserved for inherently not codable concepts without codable children: Secondary | ICD-10-CM

## 2013-10-05 DIAGNOSIS — M4850XS Collapsed vertebra, not elsewhere classified, site unspecified, sequela of fracture: Principal | ICD-10-CM

## 2013-10-05 MED ORDER — OXYCODONE-ACETAMINOPHEN 5-325 MG PO TABS: 1.0000 | ORAL_TABLET | Freq: Two times a day (BID) | ORAL | Status: DC | PRN

## 2013-10-05 NOTE — Telephone Encounter (Signed)
Patient stated that he was referred to [facility] in [place]. Patient has not had a face to face yet. Pt has only been over to pain clinic to sign paperwork.
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
## 2013-10-05 NOTE — Telephone Encounter (Signed)
Please find out what pain management clinic he spoke with.

## 2013-10-05 NOTE — Telephone Encounter (Signed)
Patient notified. Patient will come by office to pick up script.

## 2013-10-06 ENCOUNTER — Encounter: Payer: Self-pay | Admitting: *Deleted

## 2013-10-06 ENCOUNTER — Telehealth: Payer: Self-pay | Admitting: *Deleted

## 2013-10-06 DIAGNOSIS — R079 Chest pain, unspecified: Secondary | ICD-10-CM

## 2013-10-06 NOTE — Telephone Encounter (Signed)
Notes Recorded by Laurine Blazer, LPN on 5/37/4827 at 0:78 PM Patient notified & is in agreement to do [place]. Instructions given to have nothing to eat/drink x 4 hours prior to test and no caffeine x 24 hours prior to test. Also, informed patient that he may take all medications morning of test.

## 2013-10-06 NOTE — Telephone Encounter (Signed)
Message copied by Laurine Blazer on Fri Oct 06, 2013  2:14 PM ------      Message from: [place] F      Created: Thu Oct 05, 2013  4:20 PM       Echo looks good, his heart function is normal. Echo does not tell us if he has potential blockages in the arteries of his hear that could be causing his symptoms. He needs a Tax inspector. Does not need to hold any meds            Zandra Abts MD ------

## 2013-10-06 NOTE — Telephone Encounter (Signed)
Order will be forwarded to [facility]) for scheduling.

## 2013-10-12 ENCOUNTER — Ambulatory Visit: Payer: Medicare Other | Admitting: Nurse Practitioner

## 2013-10-12 ENCOUNTER — Encounter: Payer: Self-pay | Admitting: Nurse Practitioner

## 2013-10-12 ENCOUNTER — Ambulatory Visit (INDEPENDENT_AMBULATORY_CARE_PROVIDER_SITE_OTHER): Payer: Medicare Other | Admitting: Nurse Practitioner

## 2013-10-12 VITALS — BP 131/83 | HR 82 | Temp 98.0°F | Resp 18 | Ht 68.0 in | Wt 146.0 lb

## 2013-10-12 DIAGNOSIS — M4850XA Collapsed vertebra, not elsewhere classified, site unspecified, initial encounter for fracture: Secondary | ICD-10-CM | POA: Insufficient documentation

## 2013-10-12 DIAGNOSIS — F411 Generalized anxiety disorder: Secondary | ICD-10-CM

## 2013-10-12 DIAGNOSIS — F17210 Nicotine dependence, cigarettes, uncomplicated: Secondary | ICD-10-CM | POA: Insufficient documentation

## 2013-10-12 DIAGNOSIS — R4589 Other symptoms and signs involving emotional state: Secondary | ICD-10-CM

## 2013-10-12 DIAGNOSIS — F17200 Nicotine dependence, unspecified, uncomplicated: Secondary | ICD-10-CM

## 2013-10-12 DIAGNOSIS — R3129 Other microscopic hematuria: Secondary | ICD-10-CM

## 2013-10-12 DIAGNOSIS — M4850XS Collapsed vertebra, not elsewhere classified, site unspecified, sequela of fracture: Secondary | ICD-10-CM

## 2013-10-12 DIAGNOSIS — R079 Chest pain, unspecified: Secondary | ICD-10-CM

## 2013-10-12 DIAGNOSIS — IMO0001 Reserved for inherently not codable concepts without codable children: Secondary | ICD-10-CM

## 2013-10-12 DIAGNOSIS — IMO0002 Reserved for concepts with insufficient information to code with codable children: Secondary | ICD-10-CM

## 2013-10-12 DIAGNOSIS — Z23 Encounter for immunization: Secondary | ICD-10-CM

## 2013-10-12 MED ORDER — OXYCODONE-ACETAMINOPHEN 5-325 MG PO TABS
1.0000 | ORAL_TABLET | Freq: Two times a day (BID) | ORAL | Status: DC | PRN
Start: 2013-10-12 — End: 2013-10-31

## 2013-10-12 MED ORDER — LORAZEPAM 1 MG PO TABS: 1.0000 mg | ORAL_TABLET | Freq: Two times a day (BID) | ORAL | Status: DC | PRN

## 2013-10-12 MED ORDER — BUPROPION HCL ER (SR) 150 MG PO TB12: ORAL_TABLET | ORAL | Status: DC
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
## 2013-10-12 NOTE — Patient Instructions (Signed)
Please start wellbutrin: take at bedtime for 3 days, then increase to twice daily-take every 12 hours.  I have increased dose on ativan, you may take it twice daily.  Continue vitamin D supplement.  Keep appointment with [facility].  I will refill oxycodone until your work up is complete.  Consider back & leg stretches daily.  Please see me in 5 weeks to evaluate wellbutrin.  You may use nicotene patches: It is OK to remove at night, if they keep you awake. Do not place on red skin. Change site every day.  Nice to see you!

## 2013-10-12 NOTE — Progress Notes (Signed)
Pre visit review using our clinic review tool, if applicable. No additional management support is needed unless otherwise documented below in the visit note. 

## 2013-10-12 NOTE — Assessment & Plan Note (Signed)
Pt stopped prozac due to intolerable SE: "bumping into things, felt foggy". Taking ativan, but doubling dose to sleep. Took paxil in past-made him feel more nervous. Start wellbutrin. Increase ativan to 1 mg bid prn. F/u 5 weeks.

## 2013-10-12 NOTE — Assessment & Plan Note (Signed)
Less pain when takes ativan. May be r/t anxiety. Start wellbutrin.
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
## 2013-10-12 NOTE — Progress Notes (Signed)
Subjective:     Jeffrey Mendez is a 58 y.o. male presents for f/u anxiety, vit d deficiency, hematuria, & chronic back pain. He was started on prozac 1 mo/ ago, he tookit for 1 week then stopped due to "bumping into things & feeling foggy". He is taking ativan daily & sometimes takes 2 tabs to help with sleep. He feels less chest pain when he takes ativan & pain medicine. He has stress test scheduled tomorrow. He started vitamin D supplement. He saw Dr Karsten Ro since last appointment. I do not have records to review. Pt states back pain got worse when he was taking cipro so he stopped after 7 days. I am concerned about hematuria, as he is smoker-risk for bladder ca. Pt c/o leg cramps at night-takes potassium everyday. Continues to c/o neck & back pain. Pain clinic has refused to see him. I have referred to [facility] [place]. Appt. Is 9/10. Oxycodone 5mg  BID "takes edge off". I think mr Betty has legitimate back pain, but also has underlying anxiety for which he has gotten some relief form chronic pain meds. He states his life is difficult due to tight finances, son has depression, etc.  The following portions of the patient's history were reviewed and updated as appropriate: allergies, current medications, past family history, past medical history, past social history, past surgical history and problem list.  Review of Systems Pertinent items are noted in HPI.    Objective:    BP 131/83  Pulse 82  Temp(Src) 98 F (36.7 C) (Oral)  Resp 18  Ht 5\' 8"  (1.727 m)  Wt 146 lb (66.225 kg)  BMI 22.20 kg/m2  SpO2 98% BP 131/83  Pulse 82  Temp(Src) 98 F (36.7 C) (Oral)  Resp 18  Ht 5\' 8"  (1.727 m)  Wt 146 lb (66.225 kg)  BMI 22.20 kg/m2  SpO2 98% General appearance: alert, cooperative, appears stated age and no distress Head: Normocephalic, without obvious abnormality, atraumatic Eyes: negative findings: lids and lashes normal and conjunctivae and sclerae normal     Assessment:     1. Feeling anxious - LORazepam (ATIVAN) 1 MG tablet; Take 1 tablet (1 mg total) by mouth 2 (two) times daily as needed for anxiety.  Dispense: 60 tablet; Refill: 1  2. Anxiety state, unspecified - buPROPion (WELLBUTRIN SR) 150 MG 12 hr tablet; Take 1T po qhs X 3 nights, then increase to 1 T po qam & qhs.  Dispense: 60 tablet; Refill: 1  3. Hematuria, microscopic - Urinalysis, Routine w reflex microscopic  4. Need for prophylactic vaccination and inoculation against influenza - Flu Vaccine QUAD 36+ mos PF IM (Fluarix Quad PF)  5. Vertebral compression fracture, sequela - oxyCODONE-acetaminophen (PERCOCET/ROXICET) 5-325 MG per tablet; Take 1 tablet by mouth every 12 (twelve) hours as needed for severe pain. Do not fill before 10/22/2013.  Dispense: 20 tablet; Refill: 0  6. Chest pain on exertion  7. Heavy smoker. Use 21 mg nicotene patches. OK to take off at night. Pt already has prescription.  See problem list for complete A&P See pt instructions. F/u 5 weeks.

## 2013-10-12 NOTE — Assessment & Plan Note (Signed)
Moderate hgb on micro. Risk factor: smoker. Will repeat ua w/micro today. Will request records from Dr Karsten Ro.

## 2013-10-13 ENCOUNTER — Ambulatory Visit (HOSPITAL_COMMUNITY)
Admission: RE | Admit: 2013-10-13 | Discharge: 2013-10-13 | Disposition: A | Discharge status: Home / Self Care | Payer: Medicare Other | Source: Ambulatory Visit | Attending: Cardiology | Admitting: Cardiology

## 2013-10-13 ENCOUNTER — Encounter (HOSPITAL_COMMUNITY): Payer: Self-pay

## 2013-10-13 ENCOUNTER — Encounter (HOSPITAL_COMMUNITY)
Admission: RE | Admit: 2013-10-13 | Discharge: 2013-10-13 | Disposition: A | Discharge status: Home / Self Care | Payer: Medicare Other | Source: Ambulatory Visit | Attending: Cardiovascular Disease | Admitting: Cardiovascular Disease

## 2013-10-13 ENCOUNTER — Encounter (HOSPITAL_COMMUNITY)
Admission: RE | Admit: 2013-10-13 | Discharge: 2013-10-13 | Disposition: A | Discharge status: Home / Self Care | Payer: Medicare Other | Source: Ambulatory Visit | Attending: Cardiology | Admitting: Cardiology

## 2013-10-13 DIAGNOSIS — E785 Hyperlipidemia, unspecified: Secondary | ICD-10-CM | POA: Insufficient documentation

## 2013-10-13 DIAGNOSIS — R002 Palpitations: Secondary | ICD-10-CM | POA: Insufficient documentation

## 2013-10-13 DIAGNOSIS — R0602 Shortness of breath: Secondary | ICD-10-CM | POA: Diagnosis not present

## 2013-10-13 DIAGNOSIS — F17200 Nicotine dependence, unspecified, uncomplicated: Secondary | ICD-10-CM | POA: Diagnosis not present

## 2013-10-13 DIAGNOSIS — R079 Chest pain, unspecified: Secondary | ICD-10-CM | POA: Diagnosis not present

## 2013-10-13 LAB — URINALYSIS, ROUTINE W REFLEX MICROSCOPIC
BILIRUBIN URINE: NEGATIVE
KETONES UR: NEGATIVE
LEUKOCYTES UA: NEGATIVE
NITRITE: NEGATIVE
Specific Gravity, Urine: <=1.005 — AB (ref 1.000–1.030)
Total Protein, Urine: NEGATIVE
UROBILINOGEN UA: 0.2 (ref 0.0–1.0)
Urine Glucose: NEGATIVE
pH: 6.5 (ref 5.0–8.0)

## 2013-10-13 MED ORDER — SODIUM CHLORIDE 0.9 % IJ SOLN
INTRAMUSCULAR | Status: AC
  Administered 2013-10-13: 10 mL via INTRAVENOUS
  Filled 2013-10-13: qty 10

## 2013-10-13 MED ORDER — TECHNETIUM TC 99M SESTAMIBI - CARDIOLITE
10.0000 | Freq: Once | INTRAVENOUS | Status: AC | PRN
  Administered 2013-10-13: 10:00:00 10 via INTRAVENOUS

## 2013-10-13 MED ORDER — TECHNETIUM TC 99M SESTAMIBI GENERIC - CARDIOLITE
30.0000 | Freq: Once | INTRAVENOUS | Status: AC | PRN
  Administered 2013-10-13: 30 via INTRAVENOUS

## 2013-10-13 MED ORDER — REGADENOSON 0.4 MG/5ML IV SOLN
0.4000 mg | Freq: Once | INTRAVENOUS | Status: AC | PRN
  Administered 2013-10-13: 0.4 mg via INTRAVENOUS

## 2013-10-13 MED ORDER — SODIUM CHLORIDE 0.9 % IJ SOLN
10.0000 mL | INTRAMUSCULAR | Status: DC | PRN
  Administered 2013-10-13: 10 mL via INTRAVENOUS

## 2013-10-13 MED ORDER — REGADENOSON 0.4 MG/5ML IV SOLN
INTRAVENOUS | Status: AC
  Administered 2013-10-13: 0.4 mg via INTRAVENOUS
  Filled 2013-10-13: qty 5

## 2013-10-13 NOTE — Progress Notes (Signed)
Stress Lab Nurses Notes - Jeffrey Mendez  Jeffrey Mendez 10/13/2013 Reason for doing test: Chest Pain and palpitation Type of test: Wille Glaser Nurse performing test: Gerrit Halls, RN Nuclear Medicine Tech: Melburn Hake Echo Tech: Not Applicable MD performing test: Koneswaran/K.Purcell Nails NP Family MD: [name] NP Test explained and consent signed: Yes.   IV started: Saline lock flushed, No redness or edema and Saline lock started in radiology Symptoms: pressure in stomach & SOB Treatment/Intervention: None Reason test stopped: protocol completed After recovery IV was: Discontinued via X-ray tech and No redness or edema Patient to return to Nuc. Med at :12:30 Patient discharged: Home Patient's Condition upon discharge was: stable Comments: During test BP 124/81 & HR 107.  Recovery BP 112/80 & HR 98.  Symptoms resolved in recovery. [name] T

## 2013-10-16 ENCOUNTER — Encounter: Payer: Self-pay | Admitting: Nurse Practitioner

## 2013-10-17 ENCOUNTER — Telehealth: Payer: Self-pay | Admitting: *Deleted

## 2013-10-17 ENCOUNTER — Encounter: Payer: Self-pay | Admitting: Nurse Practitioner

## 2013-10-17 DIAGNOSIS — D3500 Benign neoplasm of unspecified adrenal gland: Secondary | ICD-10-CM | POA: Insufficient documentation

## 2013-10-17 NOTE — Telephone Encounter (Signed)
Message copied by Laurine Blazer on Tue Oct 17, 2013 10:40 AM ------      Message from: [place] F      Created: Mon Oct 16, 2013  3:04 PM       Stress test looks good without any evidence of blockages. He should see me in 2 weeks or so to discuss in detail and follow up on his symptoms            Zandra Abts MD ------

## 2013-10-17 NOTE — Telephone Encounter (Signed)
Notes Recorded by Laurine Blazer, LPN on 09/22/5641 at 32:95 AM Left message to return call.

## 2013-10-18 NOTE — Telephone Encounter (Signed)
Mr. Brimage returned call. He wants to go ahead and schedule appointment with Dr. Harl Bowie.

## 2013-10-19 NOTE — Telephone Encounter (Signed)
Notes Recorded by Laurine Blazer, LPN on 0/03/6376 at 58:85 AM Patient notified. Follow up scheduled for 11/02/2013 with Dr. Harl Bowie.

## 2013-10-31 ENCOUNTER — Encounter: Payer: Self-pay | Admitting: Nurse Practitioner

## 2013-10-31 ENCOUNTER — Other Ambulatory Visit: Payer: Self-pay | Admitting: Orthopaedic Surgery

## 2013-10-31 ENCOUNTER — Telehealth: Payer: Self-pay | Admitting: Family Medicine

## 2013-10-31 DIAGNOSIS — IMO0001 Reserved for inherently not codable concepts without codable children: Secondary | ICD-10-CM

## 2013-10-31 DIAGNOSIS — M47819 Spondylosis without myelopathy or radiculopathy, site unspecified: Secondary | ICD-10-CM

## 2013-10-31 DIAGNOSIS — M4850XS Collapsed vertebra, not elsewhere classified, site unspecified, sequela of fracture: Principal | ICD-10-CM

## 2013-10-31 MED ORDER — OXYCODONE-ACETAMINOPHEN 5-325 MG PO TABS: 1.0000 | ORAL_TABLET | Freq: Two times a day (BID) | ORAL | Status: DC | PRN

## 2013-10-31 NOTE — Telephone Encounter (Signed)
OK to refill 60 Tabs, 0 refills. Please call pt & let him know that he must get tests completed that were ordered by [facility] provider & return to them for f/u appt.

## 2013-10-31 NOTE — Telephone Encounter (Signed)
Pt aware that he must comply w/ spine and scoliosis center.  Rx awaiting P/U at front desk.

## 2013-10-31 NOTE — Telephone Encounter (Signed)
Pt LMOM requesting a RF of his pain medication until he can get into pain clinic.  Spine center wouldn't fill RX pain medication but they have started referral process for pain management.  Please advise rf of Oxycodone.

## 2013-11-02 ENCOUNTER — Encounter: Payer: Self-pay | Admitting: Cardiology

## 2013-11-02 ENCOUNTER — Ambulatory Visit (INDEPENDENT_AMBULATORY_CARE_PROVIDER_SITE_OTHER): Payer: Medicare Other | Admitting: Cardiology

## 2013-11-02 VITALS — BP 130/83 | HR 86 | Ht 68.0 in | Wt 147.0 lb

## 2013-11-02 DIAGNOSIS — R002 Palpitations: Secondary | ICD-10-CM

## 2013-11-02 DIAGNOSIS — R079 Chest pain, unspecified: Secondary | ICD-10-CM
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
## 2013-11-02 NOTE — Patient Instructions (Signed)
Continue all current medications. Your physician wants you to follow up in:  1 year.  You will receive a reminder letter in the mail one-two months in advance.  If you don't receive a letter, please call our office to schedule the follow up appointment   

## 2013-11-16 ENCOUNTER — Ambulatory Visit (INDEPENDENT_AMBULATORY_CARE_PROVIDER_SITE_OTHER): Payer: Medicare Other | Admitting: Nurse Practitioner

## 2013-11-16 ENCOUNTER — Encounter: Payer: Self-pay | Admitting: Nurse Practitioner

## 2013-11-16 VITALS — BP 112/77 | HR 96 | Temp 97.7°F | Ht 68.0 in | Wt 147.2 lb

## 2013-11-16 DIAGNOSIS — F331 Major depressive disorder, recurrent, moderate: Secondary | ICD-10-CM

## 2013-11-16 DIAGNOSIS — D3500 Benign neoplasm of unspecified adrenal gland: Secondary | ICD-10-CM

## 2013-11-16 DIAGNOSIS — Z86018 Personal history of other benign neoplasm: Secondary | ICD-10-CM

## 2013-11-16 DIAGNOSIS — F341 Dysthymic disorder: Secondary | ICD-10-CM

## 2013-11-16 DIAGNOSIS — M4850XS Collapsed vertebra, not elsewhere classified, site unspecified, sequela of fracture: Secondary | ICD-10-CM

## 2013-11-16 DIAGNOSIS — F419 Anxiety disorder, unspecified: Secondary | ICD-10-CM

## 2013-11-16 DIAGNOSIS — IMO0001 Reserved for inherently not codable concepts without codable children: Secondary | ICD-10-CM

## 2013-11-16 DIAGNOSIS — K2100 Gastro-esophageal reflux disease with esophagitis, without bleeding: Secondary | ICD-10-CM

## 2013-11-16 DIAGNOSIS — Z8639 Personal history of other endocrine, nutritional and metabolic disease: Secondary | ICD-10-CM

## 2013-11-16 DIAGNOSIS — R634 Abnormal weight loss: Secondary | ICD-10-CM

## 2013-11-16 DIAGNOSIS — R4589 Other symptoms and signs involving emotional state: Secondary | ICD-10-CM

## 2013-11-16 MED ORDER — OXYCODONE-ACETAMINOPHEN 5-325 MG PO TABS: 1.0000 | ORAL_TABLET | Freq: Three times a day (TID) | ORAL | Status: DC | PRN

## 2013-11-16 MED ORDER — LORAZEPAM 2 MG PO TABS: 2.0000 mg | ORAL_TABLET | Freq: Three times a day (TID) | ORAL | Status: DC | PRN

## 2013-11-16 MED ORDER — DULOXETINE HCL 60 MG PO CPEP: 60.0000 mg | ORAL_CAPSULE | Freq: Every day | ORAL | Status: DC

## 2013-11-16 NOTE — Patient Instructions (Signed)
Start cymbalta.  I increased ativan dose per pill. You can take it 3 times daily if needed.  I increased oxycodone to 3 times daily.  Call [facility] Dept & ask about pharmacy program for medication assistance.  Please call [facility] Dept.) for your son or take him to ER for evaluation.  Call [facility] [phone]) or Dr Gilford Rile & ask for a referral for [facility]  for your mom. Maybe an in-home evaluation will be helpful.  Please return in 6 weeks.

## 2013-11-16 NOTE — Progress Notes (Signed)
Pre visit review using our clinic review tool, if applicable. No additional management support is needed unless otherwise documented below in the visit note. 

## 2013-11-17 ENCOUNTER — Telehealth: Payer: Self-pay | Admitting: Nurse Practitioner
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
## 2013-11-17 NOTE — Telephone Encounter (Signed)
pls call pt: Advise  Pls ask pt is he was able to get cymbalta. Advise I referred him to [facility] in [place] for therapy. I also ordered urine test to check cortisol level because he has an adrenal tumor-they are considered benign, but should be checked occasionally to make sure they are not secreting hormones. He should go to [facility] in [place] for test. It will be a 24 hr urine collection-they should give him instructions. [place], Ray

## 2013-11-17 NOTE — Telephone Encounter (Signed)
emmi mailed  °

## 2013-11-17 NOTE — Assessment & Plan Note (Addendum)
Paxil made anxiety worse. wellbutrin made him cry. Will try cymbalta. Increase ativan. Will refer to [facility] in [place]

## 2013-11-17 NOTE — Telephone Encounter (Signed)
Patient stated that he has not picked his rx up yet but pharmacist to let him know it will only be $4.00. Advised pt of referral and lab orders.

## 2013-11-17 NOTE — Assessment & Plan Note (Signed)
Weight is stable in last few mos. Thyroid studies nml. There is a Hx of adrenal adenoma

## 2013-11-17 NOTE — Assessment & Plan Note (Signed)
Likely due to heavy smoker, depression.

## 2013-11-17 NOTE — Assessment & Plan Note (Signed)
Will order urinary free cortisol to see if adrenal tumors secreting cortisol.  Per paper published 2014 "incidentalomas" should be followed as pts are at increased risk for MI & stroke.  Lancet Diabetes & Endo, Mar 16, 2012. Recommended to determine if "silent" tumors are secreting or non-secreting. Dexamethasone suppression test used in study q18 to 30 mos in 1st 5 years of CT finding.

## 2013-12-07 ENCOUNTER — Telehealth: Payer: Self-pay | Admitting: *Deleted

## 2013-12-07 NOTE — Telephone Encounter (Addendum)
Patient wants to know if pain clinic has contacted [place]. Pt also said that [facility] in [place] is closed. Patient has not had urine lab done yet. Will find another lab close to the pt. Patient also wanted to let you know that he cannot take cymbalta, med caused him to have severe migraine and eye pain after taking it. Patient stated that he hurt his ribs, on left side. Pt is requesting another pain med. Please advise?

## 2013-12-07 NOTE — Telephone Encounter (Signed)
pls call [facility]. Advise Jeffrey Mendez is mutual patient & we understand that they are working on pain management referral for him. What is status?

## 2013-12-07 NOTE — Telephone Encounter (Signed)
Called [facility] about referral to pain management. [facility] stated that they have sent all the info that they have on patient to pain management. [facility] also stated that they have inquired about referral to pain management and were told that pt's case is still under review.

## 2013-12-08 ENCOUNTER — Other Ambulatory Visit: Payer: Self-pay | Admitting: Nurse Practitioner

## 2013-12-08 DIAGNOSIS — G894 Chronic pain syndrome: Secondary | ICD-10-CM

## 2013-12-08 NOTE — Telephone Encounter (Signed)
Patient notified. Patient stated that he is willing to try Tramadol.

## 2013-12-08 NOTE — Telephone Encounter (Signed)
Will not prescribe more pain meds until 10/30. He can try tramadol if he wishes. Tell him what [facility] said about pain mngmt referral. He needs to follow up with them to inquire about process.

## 2013-12-12 ENCOUNTER — Other Ambulatory Visit: Payer: Self-pay | Admitting: Nurse Practitioner

## 2013-12-12 DIAGNOSIS — M549 Dorsalgia, unspecified: Principal | ICD-10-CM

## 2013-12-12 DIAGNOSIS — G8929 Other chronic pain: Secondary | ICD-10-CM

## 2013-12-12 MED ORDER — TRAMADOL HCL (ER BIPHASIC) 100 MG PO CP24: ORAL_CAPSULE | ORAL | Status: DC

## 2013-12-13 ENCOUNTER — Other Ambulatory Visit: Payer: Self-pay | Admitting: Nurse Practitioner

## 2013-12-13 DIAGNOSIS — M549 Dorsalgia, unspecified: Principal | ICD-10-CM

## 2013-12-13 DIAGNOSIS — G8929 Other chronic pain: Secondary | ICD-10-CM

## 2013-12-13 MED ORDER — TRAMADOL HCL 50 MG PO TABS: ORAL_TABLET | ORAL | Status: DC

## 2013-12-14 ENCOUNTER — Telehealth: Payer: Self-pay | Admitting: *Deleted

## 2013-12-14 DIAGNOSIS — IMO0001 Reserved for inherently not codable concepts without codable children: Secondary | ICD-10-CM

## 2013-12-14 DIAGNOSIS — M4850XS Collapsed vertebra, not elsewhere classified, site unspecified, sequela of fracture: Principal | ICD-10-CM

## 2013-12-14 MED ORDER — OXYCODONE-ACETAMINOPHEN 5-325 MG PO TABS: 1.0000 | ORAL_TABLET | Freq: Three times a day (TID) | ORAL | Status: DC | PRN

## 2013-12-14 NOTE — Telephone Encounter (Signed)
Spoke with pt, advised Rx ready to pick up and we cannot increase his dose or frequency at this time. Pt understood.

## 2013-12-14 NOTE — Telephone Encounter (Signed)
Patient left vm requesting refill on oxycodone. Patient would like to have quanity increased to x4 tablets daily instead of x3. Please advise?

## 2013-12-14 NOTE — Telephone Encounter (Signed)
No, I will not increase frequency to Qid. Please refill 90 T, 0 refill.

## 2013-12-28 ENCOUNTER — Ambulatory Visit (INDEPENDENT_AMBULATORY_CARE_PROVIDER_SITE_OTHER): Payer: Medicare Other | Admitting: Nurse Practitioner

## 2013-12-28 ENCOUNTER — Encounter: Payer: Self-pay | Admitting: Nurse Practitioner

## 2013-12-28 VITALS — BP 140/84 | HR 100 | Temp 98.1°F | Resp 18 | Ht 68.0 in | Wt 146.0 lb

## 2013-12-28 DIAGNOSIS — G894 Chronic pain syndrome: Secondary | ICD-10-CM

## 2013-12-28 DIAGNOSIS — IMO0001 Reserved for inherently not codable concepts without codable children: Secondary | ICD-10-CM

## 2013-12-28 DIAGNOSIS — M4850XS Collapsed vertebra, not elsewhere classified, site unspecified, sequela of fracture: Secondary | ICD-10-CM

## 2013-12-28 DIAGNOSIS — F329 Major depressive disorder, single episode, unspecified: Secondary | ICD-10-CM

## 2013-12-28 DIAGNOSIS — R4589 Other symptoms and signs involving emotional state: Secondary | ICD-10-CM

## 2013-12-28 DIAGNOSIS — F419 Anxiety disorder, unspecified: Secondary | ICD-10-CM

## 2013-12-28 MED ORDER — LORAZEPAM 2 MG PO TABS: 2.0000 mg | ORAL_TABLET | Freq: Three times a day (TID) | ORAL | Status: DC | PRN

## 2013-12-28 MED ORDER — DULOXETINE HCL 20 MG PO CPEP: 20.0000 mg | ORAL_CAPSULE | Freq: Every day | ORAL | Status: DC

## 2013-12-28 MED ORDER — OXYCODONE-ACETAMINOPHEN 5-325 MG PO TABS: 1.0000 | ORAL_TABLET | Freq: Three times a day (TID) | ORAL | Status: DC | PRN

## 2013-12-28 NOTE — Progress Notes (Signed)
Subjective:     Jeffrey Mendez is a 58 y.o. male presents for f/u of chronic back pain, depression, reflux & Hx of incidental adrenal adenoma. Re back pain, he has appt w/[facility] 01/29/14. He continues to c/o back & neck pain-vertebral fracture, scoliosis-was evaluated by Dr Rainey Pines did not want surgery. Dr Patrice Paradise referred him to pain management for conservative treatment. Pt is taking oxycodone 5 mg tid, but still has pain. He did not fill tramadol due his concerns about reading warnings for people who have lung disease & take opioid meds. Reflux is controlled as long as he takes omeprazole. He says he is depressed, but did not keep appt w/[facility]. He says he doesn't want to tell someone all of his problems. He has been intolerant to wellbutrin-made him cry, cymbalta-took 2 days-had HA. He would like to try cymbalta at lower dose.    The following portions of the patient's history were reviewed and updated as appropriate: allergies, current medications, past medical history, past social history, past surgical history and problem list.  Review of Systems Constitutional: positive for fatigue Gastrointestinal: positive for reflux symptoms, negative for change in bowel habits Genitourinary:positive for nocturia Musculoskeletal:positive for back pain Behavioral/Psych: positive for depression and tobacco use    Objective:    BP 140/84 mmHg  Pulse 100  Temp(Src) 98.1 F (36.7 C) (Oral)  Resp 18  Ht 5\' 8"  (1.727 m)  Wt 146 lb (66.225 kg)  BMI 22.20 kg/m2  SpO2 97% BP 140/84 mmHg  Pulse 100  Temp(Src) 98.1 F (36.7 C) (Oral)  Resp 18  Ht 5\' 8"  (1.727 m)  Wt 146 lb (66.225 kg)  BMI 22.20 kg/m2  SpO2 97% General appearance: alert, cooperative, appears stated age and sad affect Smells strong of cigarette smoke Head: Normocephalic, without obvious abnormality, atraumatic Eyes: negative findings: lids and lashes normal and conjunctivae and sclerae normal    Assessment:      1. Depressed affect - DULoxetine (CYMBALTA) 20 MG capsule; Take 1 capsule (20 mg total) by mouth daily.  Dispense: 30 capsule; Refill: 1  2. Chronic pain syndrome - DULoxetine (CYMBALTA) 20 MG capsule; Take 1 capsule (20 mg total) by mouth daily.  Dispense: 30 capsule; Refill: 1 Keep pain mngmt appt. 3. Vertebral compression fracture, sequela - oxyCODONE-acetaminophen (PERCOCET/ROXICET) 5-325 MG per tablet; Take 1 tablet by mouth 3 (three) times daily as needed for severe pain. Do not fill before 01/14/2014.  Dispense: 90 tablet; Refill: 0  4. Feeling anxious - LORazepam (ATIVAN) 2 MG tablet; Take 1 tablet (2 mg total) by mouth 3 (three) times daily as needed for anxiety.  Dispense: 90 tablet; Refill: 0  F/u 6 wks if stays on cymbalta

## 2013-12-28 NOTE — Progress Notes (Signed)
Pre visit review using our clinic review tool, if applicable. No additional management support is needed unless otherwise documented below in the visit note. 

## 2013-12-28 NOTE — Patient Instructions (Signed)
Start cymbalta. Take at supper daily.  Keep appt w/ pain management.  Return 24 hr urine collection.  See you in 6 weeks to evaluate cymbalta.

## 2014-01-26 ENCOUNTER — Other Ambulatory Visit: Payer: Self-pay | Admitting: *Deleted

## 2014-01-26 DIAGNOSIS — R4589 Other symptoms and signs involving emotional state: Secondary | ICD-10-CM

## 2014-01-26 MED ORDER — LORAZEPAM 2 MG PO TABS: 2.0000 mg | ORAL_TABLET | Freq: Three times a day (TID) | ORAL | Status: DC | PRN

## 2014-01-26 NOTE — Telephone Encounter (Signed)
Refill request for Ativan Last filled by MD on- 12/28/13 #90 x0 Last Appt: 12/28/2013 Next Appt: 02/07/2014 Please advise refill?

## 2014-02-07 ENCOUNTER — Ambulatory Visit: Payer: Medicare Other | Admitting: Nurse Practitioner

## 2014-02-07 ENCOUNTER — Encounter: Payer: Self-pay | Admitting: Nurse Practitioner

## 2014-02-07 ENCOUNTER — Ambulatory Visit (INDEPENDENT_AMBULATORY_CARE_PROVIDER_SITE_OTHER): Payer: Medicare Other | Admitting: Nurse Practitioner

## 2014-02-07 VITALS — BP 116/75 | HR 89 | Temp 98.7°F | Ht 68.0 in | Wt 146.0 lb

## 2014-02-07 DIAGNOSIS — M4716 Other spondylosis with myelopathy, lumbar region: Secondary | ICD-10-CM

## 2014-02-07 DIAGNOSIS — M4120 Other idiopathic scoliosis, site unspecified: Secondary | ICD-10-CM

## 2014-02-07 DIAGNOSIS — F341 Dysthymic disorder: Secondary | ICD-10-CM

## 2014-02-07 NOTE — Progress Notes (Signed)
Pre visit review using our clinic review tool, if applicable. No additional management support is needed unless otherwise documented below in the visit note. 

## 2014-02-07 NOTE — Assessment & Plan Note (Signed)
Did not continue after 1 dose due to fatigue. He is willing to try again.

## 2014-02-07 NOTE — Patient Instructions (Signed)
I have referred you to [facility].  If you do not get appointment by mid-January, please call my office.

## 2014-02-07 NOTE — Assessment & Plan Note (Signed)
Pt is skeptical to have more surgeries, fears long recovery. And states he understands that surgery may not correct problem. He reports constant pain that limits movement and activity and decreases QOL. He has been on high doses of pain meds in past & states he does not want to be on morphine or dilaudid. He understands that I will prescribe current dose only until he gets in to a pain management clinic.
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
## 2014-02-26 ENCOUNTER — Telehealth: Payer: Self-pay

## 2014-02-26 NOTE — Telephone Encounter (Signed)
No. He is running out of options. Pls check on referral status.

## 2014-02-26 NOTE — Telephone Encounter (Signed)
Pt needs a refill on his pain medication. Pt states that Layne was going see if she could write him another Rx until he gets into Pain management. [facility] takes 3-4 months to take new pain management patients. Their doctor has to review each case. Layne did you want me to see if I could get him in another pain clinic? Please advise.

## 2014-02-26 NOTE — Telephone Encounter (Signed)
Spoke with Tiffany at pain management, she received referral but it is still in review. When I asked for an approximate time, she responded saying it may take a while.

## 2014-02-27 ENCOUNTER — Other Ambulatory Visit: Payer: Self-pay | Admitting: Nurse Practitioner

## 2014-02-27 DIAGNOSIS — M4850XS Collapsed vertebra, not elsewhere classified, site unspecified, sequela of fracture: Principal | ICD-10-CM

## 2014-02-27 DIAGNOSIS — IMO0001 Reserved for inherently not codable concepts without codable children: Secondary | ICD-10-CM

## 2014-02-27 MED ORDER — OXYCODONE-ACETAMINOPHEN 5-325 MG PO TABS: 1.0000 | ORAL_TABLET | Freq: Three times a day (TID) | ORAL | Status: DC | PRN

## 2014-02-27 NOTE — Progress Notes (Signed)
LMOVM for pt to return call 

## 2014-02-27 NOTE — Progress Notes (Signed)
Patient notified

## 2014-03-20 ENCOUNTER — Telehealth: Payer: Self-pay | Admitting: *Deleted

## 2014-03-20 DIAGNOSIS — IMO0001 Reserved for inherently not codable concepts without codable children: Secondary | ICD-10-CM

## 2014-03-20 DIAGNOSIS — M4850XS Collapsed vertebra, not elsewhere classified, site unspecified, sequela of fracture: Principal | ICD-10-CM

## 2014-03-20 NOTE — Telephone Encounter (Signed)
Patient left vm to return call.

## 2014-03-21 MED ORDER — OXYCODONE-ACETAMINOPHEN 5-325 MG PO TABS: 1.0000 | ORAL_TABLET | Freq: Three times a day (TID) | ORAL | Status: DC | PRN

## 2014-03-21 NOTE — Telephone Encounter (Signed)
Patient notified

## 2014-03-21 NOTE — Telephone Encounter (Signed)
Returned call, patient was wanting to know if we had ordered an MRI for him to be done on 2/3. Advised pt that it was not our office that ordered an MRI. Patient stated that he is to keep appt for MRI and will let Layne know the results. Patient requesting oxycodone.

## 2014-03-27 ENCOUNTER — Telehealth: Payer: Self-pay | Admitting: *Deleted

## 2014-03-27 DIAGNOSIS — R4589 Other symptoms and signs involving emotional state: Secondary | ICD-10-CM

## 2014-03-27 NOTE — Telephone Encounter (Signed)
Patient called office and ask if you would order a MRI for him. Patient stated that the MRI order was canceled from last week for cervical spine and lumbar. Patient also requested med for sinus infection. Advised pt that he would need to schedule ov.

## 2014-03-27 NOTE — Telephone Encounter (Signed)
LMOVM for pt to return call 

## 2014-03-27 NOTE — Telephone Encounter (Signed)
If he has decided to have MRI, he should call [facility]-Dr Cohen's last note said he would order MRI if Jeffrey Mendez would consider surgery. For sinuses, he should start Neilmed sinus rinse daily. If no improvement, come in for OV.

## 2014-03-29 ENCOUNTER — Other Ambulatory Visit: Payer: Self-pay | Admitting: *Deleted

## 2014-03-29 ENCOUNTER — Other Ambulatory Visit: Payer: Self-pay | Admitting: Nurse Practitioner

## 2014-03-29 DIAGNOSIS — R4589 Other symptoms and signs involving emotional state: Secondary | ICD-10-CM

## 2014-03-29 MED ORDER — LORAZEPAM 2 MG PO TABS: 2.0000 mg | ORAL_TABLET | Freq: Three times a day (TID) | ORAL | Status: DC | PRN

## 2014-03-29 NOTE — Telephone Encounter (Signed)
Refill request for lorazepam Last filled by MD on- 01/26/2014 #90 x1 Last Appt: 02/07/2014 Next Appt: none Please advise refill?

## 2014-03-29 NOTE — Telephone Encounter (Signed)
pls send as a refill request so I can preview & sign it.

## 2014-03-29 NOTE — Telephone Encounter (Signed)
Patient returned call. Patient notified.  Patient requested refill for Ativan.  Last filled by MD on- 01/26/2014 #90 x1 Last Appt: 02/07/2014 Next Appt: none Please advise refill?

## 2014-04-23 ENCOUNTER — Telehealth: Payer: Self-pay

## 2014-04-23 NOTE — Telephone Encounter (Signed)
Please Advise: Patient called wanting to get a refill on pain meds, stated he runs on out Friday. He also mentioned that he has not heard anything from the pain clinic.

## 2014-04-25 ENCOUNTER — Telehealth: Payer: Self-pay | Admitting: Nurse Practitioner

## 2014-04-25 NOTE — Telephone Encounter (Signed)
Pt called inquiring about the RX he requested on Monday. He has not heard from anyone.

## 2014-04-25 NOTE — Telephone Encounter (Signed)
Please see telephone encounter for 04/25/14

## 2014-04-25 NOTE — Telephone Encounter (Signed)
I need to speak with [facility]. They close at lunch & at 4:30, so I haven't been able to make a connection.

## 2014-04-25 NOTE — Telephone Encounter (Signed)
Please Advise: Patient called on Monday and asked for a refill on pain meds, hasn't heard anything back.  Call Documentation      Audley Hose, CMA at 04/23/2014 1:11 PM     Status: Signed       Expand All Collapse All   Please Advise: Patient called wanting to get a refill on pain meds, stated he runs on out Friday. He also mentioned that he has not heard anything from the pain clinic.

## 2014-04-26 NOTE — Telephone Encounter (Signed)
2 attempts to reach Jeffrey Mendez regarding referral. NEED TO KNOW IF PT IS ACCEPTED OR DENIED. Left 2 msgs for TIffany-pt referral coordinator.

## 2014-04-27 ENCOUNTER — Telehealth: Payer: Medicare Other | Admitting: Nurse Practitioner

## 2014-04-27 DIAGNOSIS — M4850XS Collapsed vertebra, not elsewhere classified, site unspecified, sequela of fracture: Secondary | ICD-10-CM

## 2014-04-27 DIAGNOSIS — R52 Pain, unspecified: Secondary | ICD-10-CM

## 2014-04-27 DIAGNOSIS — IMO0001 Reserved for inherently not codable concepts without codable children: Secondary | ICD-10-CM

## 2014-04-27 DIAGNOSIS — F1190 Opioid use, unspecified, uncomplicated: Secondary | ICD-10-CM

## 2014-04-27 MED ORDER — OXYCODONE-ACETAMINOPHEN 5-325 MG PO TABS: 1.0000 | ORAL_TABLET | Freq: Three times a day (TID) | ORAL | Status: AC | PRN

## 2014-04-27 NOTE — Telephone Encounter (Signed)
pls call pt: Advise I will fill prescription today for 30 days.   [facility] may not review his case for several more months. He must schedule OV in next 3 to 4 weeks.

## 2014-04-27 NOTE — Telephone Encounter (Signed)
LMOVM to CB

## 2014-04-27 NOTE — Telephone Encounter (Signed)
Patient is aware that Rx is at front desk and that he NEEDS an OV with Layne in the next 3-4 weeks.

## 2014-05-09 ENCOUNTER — Other Ambulatory Visit: Payer: Self-pay

## 2014-05-09 MED ORDER — TAMSULOSIN HCL 0.4 MG PO CAPS: 0.4000 mg | ORAL_CAPSULE | Freq: Every day | ORAL | Status: DC

## 2014-05-09 NOTE — Telephone Encounter (Signed)
Please Advise Refill Request? Refill request for- Tamsulosin 0.4mg  Cap Last filled by MD on - 01/14/14 Last Appt - 02/07/14        Next Appt - [place]

## 2014-05-17 ENCOUNTER — Encounter: Payer: Self-pay | Admitting: Nurse Practitioner

## 2014-05-17 ENCOUNTER — Ambulatory Visit (INDEPENDENT_AMBULATORY_CARE_PROVIDER_SITE_OTHER): Payer: Medicare Other | Admitting: Nurse Practitioner

## 2014-05-17 VITALS — BP 124/84 | HR 93 | Temp 98.0°F | Ht 68.0 in | Wt 152.0 lb

## 2014-05-17 DIAGNOSIS — R82998 Other abnormal findings in urine: Secondary | ICD-10-CM

## 2014-05-17 DIAGNOSIS — M545 Low back pain, unspecified: Secondary | ICD-10-CM

## 2014-05-17 DIAGNOSIS — J449 Chronic obstructive pulmonary disease, unspecified: Secondary | ICD-10-CM | POA: Diagnosis not present

## 2014-05-17 DIAGNOSIS — R8299 Other abnormal findings in urine: Secondary | ICD-10-CM | POA: Diagnosis not present

## 2014-05-17 MED ORDER — FLUTICASONE-SALMETEROL 230-21 MCG/ACT IN AERO: 2.0000 | INHALATION_SPRAY | Freq: Two times a day (BID) | RESPIRATORY_TRACT | Status: DC
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
## 2014-05-17 NOTE — Progress Notes (Signed)
Pre visit review using our clinic review tool, if applicable. No additional management support is needed unless otherwise documented below in the visit note. 

## 2014-05-17 NOTE — Patient Instructions (Addendum)
GREAT job with cutting back cigarettes.  You should use 21 mg nicotene patch for 6 weeks, then 14 mg patch for 2 weeks, then 7 mg patch for 2 weeks. Let me know if you want prescription.  You will feel better if you stop smoking & you will increase lung function. Walk in 10 minute increments at least 3 times daily. Increase 3 minutes at a time to build stamina.  Use NEilmed sinus rinse daily for best sinus health. Start inhaler. Use as directed.  My office will call with urine results.  I have referred you to Dr Geoffery Lyons for pain management.

## 2014-05-18 ENCOUNTER — Encounter: Payer: Self-pay | Admitting: Nurse Practitioner

## 2014-05-18 LAB — URINALYSIS, ROUTINE W REFLEX MICROSCOPIC
Bilirubin Urine: NEGATIVE
Ketones, ur: NEGATIVE
LEUKOCYTES UA: NEGATIVE
Nitrite: NEGATIVE
Total Protein, Urine: NEGATIVE
UROBILINOGEN UA: 0.2 (ref 0.0–1.0)
Urine Glucose: NEGATIVE
WBC UA: NONE SEEN (ref 0–?)
pH: 6 (ref 5.0–8.0)

## 2014-05-18 LAB — URINE CULTURE
COLONY COUNT: NO GROWTH
ORGANISM ID, BACTERIA: NO GROWTH

## 2014-05-21 ENCOUNTER — Telehealth: Payer: Self-pay | Admitting: Nurse Practitioner

## 2014-05-21 NOTE — Telephone Encounter (Signed)
pls call pt: Advise No urine infection. He does have blood in urine. He has had this before, likely due to calcifications in prostate. If he has concerns, he should f/u with urologist.

## 2014-05-21 NOTE — Telephone Encounter (Signed)
LMOVM about lab results. Told patient to call back with any questions or concerns. No patient identifiers left. DPR Signed.

## 2014-06-08 ENCOUNTER — Encounter: Payer: Self-pay | Admitting: Nurse Practitioner

## 2014-06-08 DIAGNOSIS — G8929 Other chronic pain: Secondary | ICD-10-CM | POA: Insufficient documentation

## 2014-06-08 DIAGNOSIS — M549 Dorsalgia, unspecified: Secondary | ICD-10-CM

## 2014-07-11 ENCOUNTER — Encounter: Payer: Self-pay | Admitting: Gastroenterology

## 2014-10-01 ENCOUNTER — Other Ambulatory Visit: Payer: Self-pay | Admitting: Family Medicine

## 2014-10-01 MED ORDER — TAMSULOSIN HCL 0.4 MG PO CAPS: 0.4000 mg | ORAL_CAPSULE | Freq: Every day | ORAL | Status: DC

## 2015-02-19 ENCOUNTER — Telehealth: Payer: Self-pay | Admitting: Cardiology

## 2015-02-19 NOTE — Telephone Encounter (Signed)
Patient feels he does not need follow up

## 2016-06-02 ENCOUNTER — Encounter: Payer: Self-pay | Admitting: Gastroenterology

## 2016-12-23 ENCOUNTER — Other Ambulatory Visit (HOSPITAL_COMMUNITY): Payer: Self-pay | Admitting: Neurosurgery

## 2016-12-23 DIAGNOSIS — M859 Disorder of bone density and structure, unspecified: Secondary | ICD-10-CM

## 2016-12-23 DIAGNOSIS — M5137 Other intervertebral disc degeneration, lumbosacral region: Secondary | ICD-10-CM

## 2016-12-23 DIAGNOSIS — S32010A Wedge compression fracture of first lumbar vertebra, initial encounter for closed fracture: Secondary | ICD-10-CM

## 2016-12-30 ENCOUNTER — Ambulatory Visit (HOSPITAL_COMMUNITY)
Admission: RE | Admit: 2016-12-30 | Discharge: 2016-12-30 | Disposition: A | Discharge status: Home / Self Care | Payer: Medicare HMO | Source: Ambulatory Visit | Attending: Neurosurgery | Admitting: Neurosurgery

## 2016-12-30 DIAGNOSIS — Z13820 Encounter for screening for osteoporosis: Secondary | ICD-10-CM | POA: Insufficient documentation

## 2016-12-30 DIAGNOSIS — M810 Age-related osteoporosis without current pathological fracture: Secondary | ICD-10-CM | POA: Insufficient documentation

## 2016-12-30 DIAGNOSIS — S32010A Wedge compression fracture of first lumbar vertebra, initial encounter for closed fracture: Secondary | ICD-10-CM

## 2017-01-28 DIAGNOSIS — M8000XA Age-related osteoporosis with current pathological fracture, unspecified site, initial encounter for fracture: Secondary | ICD-10-CM | POA: Insufficient documentation

## 2017-03-23 DIAGNOSIS — M47816 Spondylosis without myelopathy or radiculopathy, lumbar region: Secondary | ICD-10-CM | POA: Insufficient documentation

## 2017-03-23 DIAGNOSIS — M47813 Spondylosis without myelopathy or radiculopathy, cervicothoracic region: Secondary | ICD-10-CM | POA: Insufficient documentation
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
## 2020-07-26 ENCOUNTER — Encounter: Payer: Self-pay | Admitting: Cardiovascular Disease

## 2020-07-26 ENCOUNTER — Other Ambulatory Visit: Payer: Self-pay

## 2020-07-26 ENCOUNTER — Ambulatory Visit (INDEPENDENT_AMBULATORY_CARE_PROVIDER_SITE_OTHER): Payer: Medicare HMO | Admitting: Cardiovascular Disease

## 2020-07-26 VITALS — BP 124/78 | HR 61 | Ht 65.0 in | Wt 160.8 lb

## 2020-07-26 DIAGNOSIS — Z720 Tobacco use: Secondary | ICD-10-CM

## 2020-07-26 DIAGNOSIS — E782 Mixed hyperlipidemia: Secondary | ICD-10-CM

## 2020-07-26 DIAGNOSIS — I2510 Atherosclerotic heart disease of native coronary artery without angina pectoris: Secondary | ICD-10-CM

## 2020-07-26 DIAGNOSIS — I739 Peripheral vascular disease, unspecified: Secondary | ICD-10-CM

## 2020-07-26 DIAGNOSIS — R0602 Shortness of breath: Secondary | ICD-10-CM

## 2020-07-26 MED ORDER — ASPIRIN EC 81 MG PO TBEC: 81.0000 mg | DELAYED_RELEASE_TABLET | Freq: Every day | ORAL | 3 refills | Status: DC

## 2020-07-26 NOTE — Patient Instructions (Signed)
Medication Instructions:  Start Aspirin 81 mg daily   *If you need a refill on your cardiac medications before your next appointment, please call your pharmacy*   Lab Work: BNP, BMET, LIPID today   If you have labs (blood work) drawn today and your tests are completely normal, you will receive your results only by: [place] (if you have MyChart) OR A paper copy in the mail If you have any lab test that is abnormal or we need to change your treatment, we will call you to review the results.   Testing/Procedures: Echocardiogram - Your physician has requested that you have an echocardiogram. Echocardiography is a painless test that uses sound waves to create images of your heart. It provides your doctor with information about the size and shape of your heart and how well your heart's chambers and valves are working. This procedure takes approximately one hour. There are no restrictions for this procedure. This will be performed at our [facility] location - [address].  Your physician has requested that you have a lexiscan myoview. For further information please visit [email or web address]. Please follow instruction sheet, as given.   Your physician has requested that you have a lower extremity arterial duplex. This test is an ultrasound of the arteries in the legs. It looks at arterial blood flow in the legs . Allow one hour for Lower Arterial scans. There are no restrictions or special instructions  CALCIUM SCORE  Follow-Up: At [facility], you and your health needs are our priority.  As part of our continuing mission to provide you with exceptional heart care, we have created designated Provider Care Teams.  These Care Teams include your primary Cardiologist (physician) and Advanced Practice Providers (APPs -  Physician Assistants and Nurse Practitioners) who all work together to provide you with the care you need, when you need it.  We recommend signing up for the  patient portal called "MyChart".  Sign up information is provided on this After Visit Summary.  MyChart is used to connect with patients for Virtual Visits (Telemedicine).  Patients are able to view lab/test results, encounter notes, upcoming appointments, etc.  Non-urgent messages can be sent to your provider as well.   To learn more about what you can do with MyChart, go to [email or web address].    Your next appointment:   2 month(s)  The format for your next appointment:   In Person  Provider:   Eleonore Chiquito, MD

## 2020-07-26 NOTE — Addendum Note (Signed)
Addended by: Caprice Beaver T on: 07/26/2020 02:55 PM   Modules accepted: Orders

## 2020-07-26 NOTE — Addendum Note (Signed)
Addended by: Geralynn Rile on: 07/26/2020 03:04 PM   Modules accepted: Orders

## 2020-07-27 LAB — BASIC METABOLIC PANEL
BUN/Creatinine Ratio: 11 (ref 10–24)
BUN: 11 mg/dL (ref 8–27)
CO2: 26 mmol/L (ref 20–29)
Calcium: 9.7 mg/dL (ref 8.6–10.2)
Chloride: 103 mmol/L (ref 96–106)
Creatinine, Ser: 1.03 mg/dL (ref 0.76–1.27)
Glucose: 80 mg/dL (ref 65–99)
Potassium: 4.3 mmol/L (ref 3.5–5.2)
Sodium: 142 mmol/L (ref 134–144)
eGFR: 82 mL/min/{1.73_m2} (ref >59)

## 2020-07-27 LAB — LIPID PANEL
Chol/HDL Ratio: 2.9 ratio (ref 0.0–5.0)
Cholesterol, Total: 197 mg/dL (ref 100–199)
HDL: 68 mg/dL (ref >39)
LDL Chol Calc (NIH): 115 mg/dL — ABNORMAL HIGH (ref 0–99)
Triglycerides: 78 mg/dL (ref 0–149)
VLDL Cholesterol Cal: 14 mg/dL (ref 5–40)

## 2020-07-27 LAB — BRAIN NATRIURETIC PEPTIDE: BNP: 50.4 pg/mL (ref 0.0–100.0)

## 2020-07-28 NOTE — Progress Notes (Signed)
Call in Crestor 40 mg daily. He should be taking aspirin. I will send a my chart message.   [place] T. Audie Box, MD, [employer]  [address] [place], [place] [address] [phone]  11:54 AM

## 2020-07-29 ENCOUNTER — Other Ambulatory Visit: Payer: Self-pay

## 2020-07-29 MED ORDER — ROSUVASTATIN CALCIUM 40 MG PO TABS: 40.0000 mg | ORAL_TABLET | Freq: Every day | ORAL | 3 refills | Status: DC

## 2020-08-02 ENCOUNTER — Other Ambulatory Visit (HOSPITAL_COMMUNITY): Payer: Self-pay | Admitting: Cardiovascular Disease

## 2020-08-02 DIAGNOSIS — M79605 Pain in left leg: Secondary | ICD-10-CM

## 2020-08-07 ENCOUNTER — Encounter (HOSPITAL_COMMUNITY): Payer: Medicare HMO

## 2020-08-07 ENCOUNTER — Telehealth (HOSPITAL_COMMUNITY): Payer: Self-pay | Admitting: *Deleted

## 2020-08-07 NOTE — Telephone Encounter (Signed)
Close encounter 

## 2020-08-08 ENCOUNTER — Other Ambulatory Visit: Payer: Self-pay

## 2020-08-08 ENCOUNTER — Ambulatory Visit (HOSPITAL_COMMUNITY)
Admission: RE | Admit: 2020-08-08 | Discharge: 2020-08-08 | Disposition: A | Discharge status: Home / Self Care | Payer: Medicare HMO | Source: Ambulatory Visit | Attending: Cardiovascular Disease | Admitting: Cardiovascular Disease

## 2020-08-08 DIAGNOSIS — R0602 Shortness of breath: Secondary | ICD-10-CM | POA: Insufficient documentation

## 2020-08-08 LAB — MYOCARDIAL PERFUSION IMAGING
LV dias vol: 114 mL (ref 62–150)
LV sys vol: 45 mL
Peak HR: 115 {beats}/min
Rest HR: 65 {beats}/min
SDS: 2
SRS: 3
SSS: 5
TID: 1.03

## 2020-08-08 MED ORDER — TECHNETIUM TC 99M TETROFOSMIN IV KIT
29.8000 | PACK | Freq: Once | INTRAVENOUS | Status: AC | PRN
  Administered 2020-08-08: 29.8 via INTRAVENOUS
  Filled 2020-08-08: qty 30

## 2020-08-08 MED ORDER — AMINOPHYLLINE 25 MG/ML IV SOLN
75.0000 mg | Freq: Once | INTRAVENOUS | Status: AC
  Administered 2020-08-08: 75 mg via INTRAVENOUS

## 2020-08-08 MED ORDER — REGADENOSON 0.4 MG/5ML IV SOLN
0.4000 mg | Freq: Once | INTRAVENOUS | Status: AC
  Administered 2020-08-08: 0.4 mg via INTRAVENOUS

## 2020-08-08 MED ORDER — TECHNETIUM TC 99M TETROFOSMIN IV KIT
9.9000 | PACK | Freq: Once | INTRAVENOUS | Status: AC | PRN
  Administered 2020-08-08: 9.9 via INTRAVENOUS
  Filled 2020-08-08: qty 10

## 2020-08-12 ENCOUNTER — Telehealth: Payer: Self-pay | Admitting: Cardiovascular Disease

## 2020-08-12 ENCOUNTER — Other Ambulatory Visit: Payer: Self-pay

## 2020-08-12 NOTE — Telephone Encounter (Signed)
Called patient, gave results to him.  Patient verbalized understanding. Question regarding statin medication sent to MD to advise.

## 2020-08-12 NOTE — Telephone Encounter (Signed)
Patient's sister is requesting to go over stress test results.

## 2020-08-13 ENCOUNTER — Other Ambulatory Visit: Payer: Self-pay

## 2020-08-13 MED ORDER — EZETIMIBE 10 MG PO TABS: 10.0000 mg | ORAL_TABLET | Freq: Every day | ORAL | 3 refills | Status: DC

## 2020-08-14 ENCOUNTER — Ambulatory Visit (HOSPITAL_COMMUNITY): Payer: Medicare HMO | Attending: Cardiovascular Disease

## 2020-08-14 ENCOUNTER — Ambulatory Visit (INDEPENDENT_AMBULATORY_CARE_PROVIDER_SITE_OTHER)
Admission: RE | Admit: 2020-08-14 | Discharge: 2020-08-14 | Disposition: A | Discharge status: Home / Self Care | Payer: Self-pay | Source: Ambulatory Visit | Attending: Cardiovascular Disease | Admitting: Cardiovascular Disease

## 2020-08-14 ENCOUNTER — Other Ambulatory Visit: Payer: Self-pay

## 2020-08-14 DIAGNOSIS — R0602 Shortness of breath: Secondary | ICD-10-CM | POA: Insufficient documentation

## 2020-08-14 LAB — ECHOCARDIOGRAM COMPLETE
Area-P 1/2: 3.87 cm2
S' Lateral: 3 cm

## 2020-08-20 ENCOUNTER — Telehealth: Payer: Self-pay | Admitting: Cardiovascular Disease

## 2020-08-20 ENCOUNTER — Inpatient Hospital Stay (HOSPITAL_COMMUNITY): Admission: RE | Admit: 2020-08-20 | Payer: Medicare HMO | Source: Ambulatory Visit

## 2020-08-20 NOTE — Telephone Encounter (Signed)
I attempted to contact patient to discuss. Unable to reach patient, line was busy.  Will try again.

## 2020-08-20 NOTE — Telephone Encounter (Signed)
Patient calling for CT and echo results. He states he does not know how to use mychart.

## 2020-08-21 NOTE — Telephone Encounter (Signed)
Called patient, gave results.  Patient verbalized understanding.  Patient thankful for call back.

## 2020-08-22 ENCOUNTER — Other Ambulatory Visit: Payer: Self-pay

## 2020-08-22 ENCOUNTER — Ambulatory Visit (HOSPITAL_COMMUNITY)
Admission: RE | Admit: 2020-08-22 | Discharge: 2020-08-22 | Disposition: A | Discharge status: Home / Self Care | Payer: Medicare HMO | Source: Ambulatory Visit | Attending: Cardiovascular Disease | Admitting: Cardiovascular Disease

## 2020-08-22 DIAGNOSIS — M79604 Pain in right leg: Secondary | ICD-10-CM | POA: Insufficient documentation

## 2020-08-22 DIAGNOSIS — M79605 Pain in left leg: Secondary | ICD-10-CM | POA: Diagnosis not present

## 2020-08-22 DIAGNOSIS — I739 Peripheral vascular disease, unspecified: Secondary | ICD-10-CM | POA: Diagnosis present

## 2020-10-02 ENCOUNTER — Ambulatory Visit: Payer: Medicare HMO | Admitting: Cardiovascular Disease

## 2021-07-22 ENCOUNTER — Other Ambulatory Visit: Payer: Self-pay | Admitting: Cardiovascular Disease

## 2022-01-13 ENCOUNTER — Ambulatory Visit: Payer: Self-pay | Admitting: Nurse Practitioner

## 2022-01-19 ENCOUNTER — Encounter: Payer: Self-pay | Admitting: *Deleted

## 2022-02-24 ENCOUNTER — Ambulatory Visit: Payer: Self-pay | Admitting: Nurse Practitioner

## 2022-03-03 ENCOUNTER — Ambulatory Visit: Payer: Medicare HMO | Admitting: Nurse Practitioner

## 2022-03-03 ENCOUNTER — Telehealth: Payer: Self-pay

## 2022-03-03 NOTE — Telephone Encounter (Signed)
Attempted to call pt to r/s no answer

## 2022-03-23 ENCOUNTER — Emergency Department (HOSPITAL_COMMUNITY): Payer: Medicare HMO

## 2022-03-23 ENCOUNTER — Emergency Department (HOSPITAL_COMMUNITY)
Admission: EM | Admit: 2022-03-23 | Discharge: 2022-03-23 | Disposition: A | Discharge status: Home / Self Care | Payer: Medicare HMO | Attending: Emergency Medicine | Admitting: Emergency Medicine

## 2022-03-23 ENCOUNTER — Encounter (HOSPITAL_COMMUNITY): Payer: Self-pay | Admitting: *Deleted

## 2022-03-23 ENCOUNTER — Other Ambulatory Visit: Payer: Self-pay

## 2022-03-23 DIAGNOSIS — D649 Anemia, unspecified: Secondary | ICD-10-CM | POA: Insufficient documentation

## 2022-03-23 DIAGNOSIS — Z1152 Encounter for screening for COVID-19: Secondary | ICD-10-CM | POA: Insufficient documentation

## 2022-03-23 DIAGNOSIS — Z7951 Long term (current) use of inhaled steroids: Secondary | ICD-10-CM | POA: Diagnosis not present

## 2022-03-23 DIAGNOSIS — J449 Chronic obstructive pulmonary disease, unspecified: Secondary | ICD-10-CM | POA: Diagnosis not present

## 2022-03-23 DIAGNOSIS — Z7982 Long term (current) use of aspirin: Secondary | ICD-10-CM | POA: Diagnosis not present

## 2022-03-23 DIAGNOSIS — R0789 Other chest pain: Secondary | ICD-10-CM | POA: Insufficient documentation

## 2022-03-23 LAB — CBC WITH DIFFERENTIAL/PLATELET
Abs Immature Granulocytes: 0.02 10*3/uL (ref 0.00–0.07)
Basophils Absolute: 0 10*3/uL (ref 0.0–0.1)
Basophils Relative: 1 %
Eosinophils Absolute: 0.2 10*3/uL (ref 0.0–0.5)
Eosinophils Relative: 2 %
HCT: 35.9 % — ABNORMAL LOW (ref 39.0–52.0)
Hemoglobin: 11.6 g/dL — ABNORMAL LOW (ref 13.0–17.0)
Immature Granulocytes: 0 %
Lymphocytes Relative: 17 %
Lymphs Abs: 1 10*3/uL (ref 0.7–4.0)
MCH: 29.1 pg (ref 26.0–34.0)
MCHC: 32.3 g/dL (ref 30.0–36.0)
MCV: 90.2 fL (ref 80.0–100.0)
Monocytes Absolute: 0.6 10*3/uL (ref 0.1–1.0)
Monocytes Relative: 9 %
Neutro Abs: 4.5 10*3/uL (ref 1.7–7.7)
Neutrophils Relative %: 71 %
Platelets: 216 10*3/uL (ref 150–400)
RBC: 3.98 MIL/uL — ABNORMAL LOW (ref 4.22–5.81)
RDW: 13.6 % (ref 11.5–15.5)
WBC: 6.3 10*3/uL (ref 4.0–10.5)
nRBC: 0 % (ref 0.0–0.2)

## 2022-03-23 LAB — COMPREHENSIVE METABOLIC PANEL
ALT: 12 U/L (ref 0–44)
AST: 21 U/L (ref 15–41)
Albumin: 3.8 g/dL (ref 3.5–5.0)
Alkaline Phosphatase: 78 U/L (ref 38–126)
Anion gap: 9 (ref 5–15)
BUN: 15 mg/dL (ref 8–23)
CO2: 28 mmol/L (ref 22–32)
Calcium: 9.1 mg/dL (ref 8.9–10.3)
Chloride: 101 mmol/L (ref 98–111)
Creatinine, Ser: 1.01 mg/dL (ref 0.61–1.24)
GFR, Estimated: >60 mL/min (ref >60)
Glucose, Bld: 103 mg/dL — ABNORMAL HIGH (ref 70–99)
Potassium: 4.5 mmol/L (ref 3.5–5.1)
Sodium: 138 mmol/L (ref 135–145)
Total Bilirubin: 0.6 mg/dL (ref 0.3–1.2)
Total Protein: 6.5 g/dL (ref 6.5–8.1)

## 2022-03-23 LAB — RESP PANEL BY RT-PCR (RSV, FLU A&B, COVID)  RVPGX2
Influenza A by PCR: NEGATIVE
Influenza B by PCR: NEGATIVE
Resp Syncytial Virus by PCR: NEGATIVE
SARS Coronavirus 2 by RT PCR: NEGATIVE

## 2022-03-23 LAB — TROPONIN I (HIGH SENSITIVITY)
Troponin I (High Sensitivity): 3 ng/L (ref <18)
Troponin I (High Sensitivity): 3 ng/L (ref <18)

## 2022-03-23 LAB — D-DIMER, QUANTITATIVE: D-Dimer, Quant: 0.47 ug/mL-FEU (ref 0.00–0.50)

## 2022-03-23 MED ORDER — IPRATROPIUM-ALBUTEROL 0.5-2.5 (3) MG/3ML IN SOLN
3.0000 mL | Freq: Once | RESPIRATORY_TRACT | Status: AC
  Administered 2022-03-23: 3 mL via RESPIRATORY_TRACT
  Filled 2022-03-23: qty 3

## 2022-03-23 MED ORDER — LORAZEPAM 0.5 MG PO TABS
0.5000 mg | ORAL_TABLET | Freq: Once | ORAL | Status: AC
  Administered 2022-03-23: 0.5 mg via ORAL
  Filled 2022-03-23: qty 1

## 2022-03-23 MED ORDER — HYDROXYZINE HCL 25 MG PO TABS: 25.0000 mg | ORAL_TABLET | Freq: Four times a day (QID) | ORAL | 0 refills | Status: DC | PRN

## 2022-03-23 NOTE — Discharge Instructions (Addendum)
As discussed, workup today overall reassuring.  Symptoms most likely secondary to increased anxious feelings.  Will prescribe medicine to take as needed for feelings of anxiety.  Recommend follow-up with primary care for reassessment of symptoms.  Please do not hesitate to return to emergency department for worrisome signs and symptoms we discussed become apparent.

## 2022-03-23 NOTE — ED Triage Notes (Signed)
Pt brought in by [facility] for c/o chest pain x one week that has been getting worse  Pt describes the pain as a tightness  Pt states the pain is reproducible with cough  BP 135/76 HR 85 97% on RA

## 2022-03-23 NOTE — ED Provider Notes (Signed)
[facility] Provider Note   CSN: [id number] Arrival date & time: 03/23/22  1416     History  Chief Complaint  Patient presents with   Chest Pain    Jeffrey Mendez is a 67 y.o. male.  HPI   67 year old male presents emergency department with complaints of chest pain.  Patient states that chest pain is not present persistently for the past week.  Notes beginning of chest pain with feelings of anxiousness related to a thyroid nodule.  States pain is worsened with pressing on affected side, feelings of anxiousness, cough as well as with physical exertion.  Describes the pain as tightness and states it does not radiate.  Denies shortness of breath, fever, cough, congestion, abdominal pain, nausea, vomiting.  Past medical history significant for COPD, seizure, IBS, hyperlipidemia, GERD  Home Medications Prior to Admission medications   Medication Sig Start Date End Date Taking? Authorizing Provider  aspirin EC 81 MG tablet Take 1 tablet (81 mg total) by mouth daily. Swallow whole. 07/26/20  Yes O'Neal, Cassie Freer, MD  Budeson-Glycopyrrol-Formoterol (BREZTRI AEROSPHERE) 160-9-4.8 MCG/ACT AERO Inhale 2 puffs into the lungs 2 (two) times daily.   Yes [provider]  esomeprazole (NEXIUM) 40 MG capsule Take 40 mg by mouth daily. 12/24/10  Yes Ladene Artist, MD  hydrOXYzine (ATARAX) 25 MG tablet Take 1 tablet (25 mg total) by mouth every 6 (six) hours as needed for anxiety. 03/23/22  Yes Dion Saucier A, PA  ipratropium-albuterol (DUONEB) 0.5-2.5 (3) MG/3ML SOLN Take 3 mLs by nebulization 4 (four) times daily as needed (shortness of breath). 12/16/21  Yes [provider]  MAGNESIUM PO Take by mouth.   Yes [provider]  morphine (MS CONTIN) 30 MG 12 hr tablet Take 30 mg by mouth every 6 (six) hours as needed. 07/08/20  Yes [provider]  Multiple Vitamin (MULTIVITAMIN) tablet Take 1 tablet by mouth daily.      Yes [provider]  Oxycodone HCl 10 MG TABS Take 10 mg by mouth 4 (four) times daily as needed. 03/20/22  Yes [provider]  tamsulosin (FLOMAX) 0.4 MG CAPS capsule Take 1 capsule (0.4 mg total) by mouth daily. 10/01/14  Yes Kuneff, Renee A, DO  albuterol (VENTOLIN HFA) 108 (90 Base) MCG/ACT inhaler SMARTSIG:1-2 Puff(s) Via Inhaler 4 Times Daily PRN Patient not taking: Reported on 03/23/2022 12/04/21   [provider]      Allergies    Sulfamethoxazole and Opana [oxymorphone hcl]    Review of Systems   Review of Systems  All other systems reviewed and are negative.   Physical Exam Updated Vital Signs BP 117/74   Pulse (!) 50   Temp 98.4 F (36.9 C) (Oral)   Resp 13   Ht '5\' 6"'$  (1.676 m)   Wt 54.4 kg   SpO2 95%   BMI 19.37 kg/m  Physical Exam Vitals and nursing note reviewed.  Constitutional:      General: He is not in acute distress.    Appearance: He is well-developed.  HENT:     Head: Normocephalic and atraumatic.  Eyes:     Conjunctiva/sclera: Conjunctivae normal.  Cardiovascular:     Rate and Rhythm: Normal rate and regular rhythm.     Heart sounds: No murmur heard. Pulmonary:     Effort: Pulmonary effort is normal. No respiratory distress.     Breath sounds: Normal breath sounds.     Comments: Right anterior chest  wall tenderness.  No overlying skin abnormalities appreciated. Chest:     Chest wall: Tenderness present.  Abdominal:     Palpations: Abdomen is soft.     Tenderness: There is no abdominal tenderness.  Musculoskeletal:        General: No swelling.     Cervical back: Neck supple.     Right lower leg: No edema.     Left lower leg: No edema.  Skin:    General: Skin is warm and dry.     Capillary Refill: Capillary refill takes less than 2 seconds.  Neurological:     Mental Status: He is alert.  Psychiatric:        Mood and Affect: Mood normal.     ED Results / Procedures / Treatments   Labs (all labs ordered are  listed, but only abnormal results are displayed) Labs Reviewed  COMPREHENSIVE METABOLIC PANEL - Abnormal; Notable for the following components:      Result Value   Glucose, Bld 103 (*)    All other components within normal limits  CBC WITH DIFFERENTIAL/PLATELET - Abnormal; Notable for the following components:   RBC 3.98 (*)    Hemoglobin 11.6 (*)    HCT 35.9 (*)    All other components within normal limits  RESP PANEL BY RT-PCR (RSV, FLU A&B, COVID)  [id number]  D-DIMER, QUANTITATIVE  TROPONIN I (HIGH SENSITIVITY)  TROPONIN I (HIGH SENSITIVITY)    EKG EKG Interpretation  Date/Time:  Monday March 23 2022 14:44:54 EST Ventricular Rate:  59 PR Interval:  168 QRS Duration: 101 QT Interval:  416 QTC Calculation: 413 R Axis:   36 Text Interpretation: Sinus rhythm No significant change since last tracing Confirmed by Isla Pence [phone]) on 03/23/2022 2:56:53 PM  Radiology DG Chest Port 1 View  Result Date: 03/23/2022 CLINICAL DATA:  Shortness of breath EXAM: PORTABLE CHEST - 1 VIEW COMPARISON:  08/15/2013 FINDINGS: Coarse attenuated bronchovascular markings suggesting emphysema, stable. No new infiltrate or overt edema. Heart size and mediastinal contours are within normal limits. Aortic Atherosclerosis (ICD10-170.0). Chronic blunting of the right lateral costophrenic angle. No definite pleural effusion. Stable thoracic dextroscoliosis without evident underlying vertebral anomaly. IMPRESSION: Emphysema. No acute findings. Electronically Signed   By: Lucrezia Europe M.D.   On: 03/23/2022 15:38    Procedures Procedures    Medications Ordered in ED Medications  ipratropium-albuterol (DUONEB) 0.5-2.5 (3) MG/3ML nebulizer solution 3 mL (3 mLs Nebulization Given 03/23/22 1528)  LORazepam (ATIVAN) tablet 0.5 mg (0.5 mg Oral Given 03/23/22 1623)    ED Course/ Medical Decision Making/ A&P                             Medical Decision Making Amount and/or Complexity of Data Reviewed Labs:  ordered. Radiology: ordered.  Risk Prescription drug management.   This patient presents to the ED for concern of chest pain, this involves an extensive number of treatment options, and is a complaint that carries with it a high risk of complications and morbidity.  The differential diagnosis includes ACS, PE, pneumothorax, aortic dissection, aortic aneurysm, pneumonia, GERD, pericarditis/myocarditis/tamponade, CHF   Co morbidities that complicate the patient evaluation  See HPI   Additional history obtained:  Additional history obtained from EMR External records from outside source obtained and reviewed including hospital records   Lab Tests:  I Ordered, and personally interpreted labs.  The pertinent results include: No leukocytosis noted.  Mild evidence anemia with  hemoglobin 11.6 of which is decreased in patient's baseline but patient currently without hematochezia/melena.  Platelets within normal range.  No electrolyte abnormalities appreciated.  No transaminitis.  No renal dysfunction.  Respiratory viral panel negative.  D-dimer negative.  Initial troponin of 3 with second pending; EKG with sinus rhythm with no acute ischemic changes from prior EKG performed.   Imaging Studies ordered:  I ordered imaging studies including chest x-ray I independently visualized and interpreted imaging which showed no acute cardiopulmonary abnormality I agree with the radiologist interpretation   Cardiac Monitoring: / EKG:  The patient was maintained on a cardiac monitor.  I personally viewed and interpreted the cardiac monitored which showed an underlying rhythm of: Sinus rhythm without acute ischemic change   Consultations Obtained:  I requested consultation Dr. Gilford Raid who evaluated patient independently and was in agreement with treatment plan going forward   Problem List / ED Course / Critical interventions / Medication management  Atypical chest pain I ordered medication  including duoneb, ativan   Reevaluation of the patient after these medicines showed that the patient improved I have reviewed the patients home medicines and have made adjustments as needed   Social Determinants of Health:  Chronic cigarette use.  Denies illicit drug use   Test / Admission - Considered:  Atypical chest pain Vitals signs significant for hypertension with blood pressure 683 systolic.  Recommend follow-up with primary care regarding elevation blood pressure... Otherwise within normal range and stable throughout visit. Laboratory/imaging studies significant for: See above Patient's chest pain most likely secondary to anxious feelings per patient history.  Patient with negative troponin with no new EKG changes indicative of ischemia; pending second troponin for proper heart score.  Doubt PE given negative D-dimer.  Doubt pericarditis/myocarditis/tamponade, pneumonia, aortic dissection, aortic aneurysm, pneumothorax.  Patient noted significant improvement of chest discomfort with administration of Ativan.  Most likely secondary to patient's anxious feelings.  Will discharge with Atarax to take as needed for anxious feelings with follow-up with primary care for further evaluation.  Pending second troponin at this time.  At shift change, patient care handed off to Dr. Gilford Raid.  Patient stable upon handoff.        Final Clinical Impression(s) / ED Diagnoses Final diagnoses:  Atypical chest pain    Rx / DC Orders ED Discharge Orders          Ordered    hydrOXYzine (ATARAX) 25 MG tablet  Every 6 hours PRN        03/23/22 1818              Wilnette Kales, [place] 03/23/22 Marykay Lex, MD 03/24/22 [phone]

## 2022-03-27 ENCOUNTER — Encounter (HOSPITAL_BASED_OUTPATIENT_CLINIC_OR_DEPARTMENT_OTHER): Payer: Self-pay

## 2022-03-27 ENCOUNTER — Emergency Department (HOSPITAL_BASED_OUTPATIENT_CLINIC_OR_DEPARTMENT_OTHER): Payer: Medicare HMO | Admitting: Radiology

## 2022-03-27 ENCOUNTER — Emergency Department (HOSPITAL_BASED_OUTPATIENT_CLINIC_OR_DEPARTMENT_OTHER)
Admission: EM | Admit: 2022-03-27 | Discharge: 2022-03-27 | Disposition: A | Discharge status: Home / Self Care | Payer: Medicare HMO | Attending: Emergency Medicine | Admitting: Emergency Medicine

## 2022-03-27 ENCOUNTER — Other Ambulatory Visit: Payer: Self-pay

## 2022-03-27 DIAGNOSIS — R001 Bradycardia, unspecified: Secondary | ICD-10-CM | POA: Insufficient documentation

## 2022-03-27 DIAGNOSIS — Z7982 Long term (current) use of aspirin: Secondary | ICD-10-CM | POA: Diagnosis not present

## 2022-03-27 DIAGNOSIS — I10 Essential (primary) hypertension: Secondary | ICD-10-CM | POA: Diagnosis not present

## 2022-03-27 DIAGNOSIS — F419 Anxiety disorder, unspecified: Secondary | ICD-10-CM | POA: Diagnosis not present

## 2022-03-27 DIAGNOSIS — R079 Chest pain, unspecified: Secondary | ICD-10-CM

## 2022-03-27 LAB — BASIC METABOLIC PANEL
Anion gap: 10 (ref 5–15)
BUN: 12 mg/dL (ref 8–23)
CO2: 27 mmol/L (ref 22–32)
Calcium: 9.8 mg/dL (ref 8.9–10.3)
Chloride: 101 mmol/L (ref 98–111)
Creatinine, Ser: 1.05 mg/dL (ref 0.61–1.24)
GFR, Estimated: >60 mL/min (ref >60)
Glucose, Bld: 97 mg/dL (ref 70–99)
Potassium: 4.5 mmol/L (ref 3.5–5.1)
Sodium: 138 mmol/L (ref 135–145)

## 2022-03-27 LAB — CBC
HCT: 35.3 % — ABNORMAL LOW (ref 39.0–52.0)
Hemoglobin: 11.7 g/dL — ABNORMAL LOW (ref 13.0–17.0)
MCH: 29.1 pg (ref 26.0–34.0)
MCHC: 33.1 g/dL (ref 30.0–36.0)
MCV: 87.8 fL (ref 80.0–100.0)
Platelets: 200 10*3/uL (ref 150–400)
RBC: 4.02 MIL/uL — ABNORMAL LOW (ref 4.22–5.81)
RDW: 13.6 % (ref 11.5–15.5)
WBC: 6 10*3/uL (ref 4.0–10.5)
nRBC: 0 % (ref 0.0–0.2)

## 2022-03-27 LAB — TSH: TSH: 2.25 u[IU]/mL (ref 0.350–4.500)

## 2022-03-27 LAB — TROPONIN I (HIGH SENSITIVITY): Troponin I (High Sensitivity): 3 ng/L (ref <18)

## 2022-03-27 LAB — T4, FREE: Free T4: 1.01 ng/dL (ref 0.61–1.12)

## 2022-03-27 MED ORDER — HYDROXYZINE HCL 25 MG PO TABS: 25.0000 mg | ORAL_TABLET | Freq: Four times a day (QID) | ORAL | 0 refills | Status: DC | PRN

## 2022-03-27 NOTE — ED Provider Notes (Signed)
[facility] Provider Note   CSN: [id number] Arrival date & time: 03/27/22  1423     History  Chief Complaint  Patient presents with   Chest Pain    Jeffrey Mendez is a 67 y.o. male hx of anxiety, HTN here with chest pain, concerned about his thyroid.  Patient states that for the last several days he has been very anxious.  He states that he has a thyroid nodule and was told that he may become hypothyroid.  Patient has been losing weight.  Patient was seen at [facility] several days ago and troponins were negative.  He was prescribed hydroxyzine and he ran out of it.  Patient has seen cardiology previously and has no known CAD or stents  The history is provided by the patient.       Home Medications Prior to Admission medications   Medication Sig Start Date End Date Taking? Authorizing Provider  albuterol (VENTOLIN HFA) 108 (90 Base) MCG/ACT inhaler SMARTSIG:1-2 Puff(s) Via Inhaler 4 Times Daily PRN Patient not taking: Reported on 03/23/2022 12/04/21   [provider]  aspirin EC 81 MG tablet Take 1 tablet (81 mg total) by mouth daily. Swallow whole. 07/26/20   O'NealCassie Freer, MD  Budeson-Glycopyrrol-Formoterol (BREZTRI AEROSPHERE) 160-9-4.8 MCG/ACT AERO Inhale 2 puffs into the lungs 2 (two) times daily.    [provider]  esomeprazole (NEXIUM) 40 MG capsule Take 40 mg by mouth daily. 12/24/10   Ladene Artist, MD  hydrOXYzine (ATARAX) 25 MG tablet Take 1 tablet (25 mg total) by mouth every 6 (six) hours as needed for anxiety. 03/23/22   Dion Saucier A, PA  ipratropium-albuterol (DUONEB) 0.5-2.5 (3) MG/3ML SOLN Take 3 mLs by nebulization 4 (four) times daily as needed (shortness of breath). 12/16/21   [provider]  MAGNESIUM PO Take by mouth.    [provider]  morphine (MS CONTIN) 30 MG 12 hr tablet Take 30 mg by mouth every 6 (six) hours as needed. 07/08/20   [provider]   Multiple Vitamin (MULTIVITAMIN) tablet Take 1 tablet by mouth daily.      [provider]  Oxycodone HCl 10 MG TABS Take 10 mg by mouth 4 (four) times daily as needed. 03/20/22   [provider]  tamsulosin (FLOMAX) 0.4 MG CAPS capsule Take 1 capsule (0.4 mg total) by mouth daily. 10/01/14   Kuneff, Renee A, DO      Allergies    Sulfamethoxazole and Opana [oxymorphone hcl]    Review of Systems   Review of Systems  Cardiovascular:  Positive for chest pain.  All other systems reviewed and are negative.   Physical Exam Updated Vital Signs BP (!) 146/76 (BP Location: Right Arm)   Pulse 64   Temp 97.9 F (36.6 C)   Resp 18   Ht 5' 6"$  (1.676 m)   Wt 54.4 kg   SpO2 100%   BMI 19.36 kg/m  Physical Exam Vitals and nursing note reviewed.  Constitutional:      Comments: Anxious  HENT:     Head: Normocephalic.  Eyes:     Extraocular Movements: Extraocular movements intact.     Pupils: Pupils are equal, round, and reactive to light.  Neck:     Comments: No obvious goiter or thyroid mass Cardiovascular:     Rate and Rhythm: Normal rate and regular rhythm.     Heart sounds: Normal heart sounds.  Pulmonary:  Effort: Pulmonary effort is normal.     Breath sounds: Normal breath sounds.  Abdominal:     Palpations: Abdomen is soft.  Musculoskeletal:        General: Normal range of motion.     Cervical back: Normal range of motion.  Skin:    General: Skin is warm.  Neurological:     General: No focal deficit present.     Mental Status: He is alert and oriented to person, place, and time.  Psychiatric:     Comments: Anxious     ED Results / Procedures / Treatments   Labs (all labs ordered are listed, but only abnormal results are displayed) Labs Reviewed  CBC - Abnormal; Notable for the following components:      Result Value   RBC 4.02 (*)    Hemoglobin 11.7 (*)    HCT 35.3 (*)    All other components within normal limits  BASIC METABOLIC PANEL  TSH   T4, FREE  T3  TROPONIN I (HIGH SENSITIVITY)    EKG EKG Interpretation  Date/Time:  Friday March 27 2022 14:42:55 EST Ventricular Rate:  59 PR Interval:  158 QRS Duration: 90 QT Interval:  420 QTC Calculation: 415 R Axis:   75 Text Interpretation: Sinus bradycardia Otherwise normal ECG When compared with ECG of 23-Mar-2022 14:44, PREVIOUS ECG IS PRESENT Confirmed by Wandra Arthurs [phone]) on 03/27/2022 3:17:08 PM  Radiology No results found.  Procedures Procedures    Medications Ordered in ED Medications - No data to display  ED Course/ Medical Decision Making/ A&P                             Medical Decision Making TOAN MUSKA is a 67 y.o. male here presenting with chest pain and concerned about his thyroid.  I reviewed labs from several days ago and his troponins are negative.  His pain been going on for several days.  He is also concerned about his thyroid function.  I do not appreciate any obvious thyroid mass or goiter.  Reviewed his labs and they were unremarkable.  I told him that thyroid function test is a send out to [facility] and will take about 24 hours to come back.  He is requesting hydroxyzine for anxiety.  Will also refer him back to cardiology regarding his chest pain.   Problems Addressed: Anxiety: acute illness or injury Chest pain, unspecified type: acute illness or injury  Amount and/or Complexity of Data Reviewed Labs: ordered. Decision-making details documented in ED Course. Radiology: ordered and independent interpretation performed. Decision-making details documented in ED Course. ECG/medicine tests: ordered and independent interpretation performed. Decision-making details documented in ED Course.    Final Clinical Impression(s) / ED Diagnoses Final diagnoses:  None    Rx / DC Orders ED Discharge Orders     None         Drenda Freeze, MD 03/27/22 [phone]

## 2022-03-27 NOTE — ED Notes (Signed)
Discharge instructions, follow up care, and prescription reviewed and explained, pt verbalized understanding. Pt caox4, ambulatory, NAD on d/c.

## 2022-03-27 NOTE — Discharge Instructions (Signed)
As we discussed, your heart enzyme test is normal right now.  We sent off thyroid function test and you can see it on MyChart or have your doctor follow-up with you.  Please follow-up with your primary care doctor.  I also have you return to see your heart doctor if you have persistent chest pain  I also prescribed hydroxyzine for anxiety  Return to ER if you have worse chest pain or shortness of breath or thoughts of harming yourself or others

## 2022-03-27 NOTE — ED Triage Notes (Signed)
Patient here POV from Home.  Endorses CP/SOB that began 2 Weeks ago. Worsened since it began. Endorses some Anxiety and was seen for same a few days ago in the ED but Hydroxyzine has not been very helpful. Quit Smoking 3 Weeks ago.    NAD Noted during Triage. A&Ox4. GCS 15. Ambulatory.

## 2022-03-29 LAB — T3: T3, Total: 110 ng/dL (ref 71–180)

## 2022-04-02 ENCOUNTER — Telehealth (HOSPITAL_COMMUNITY): Payer: Self-pay
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
## 2022-04-13 ENCOUNTER — Ambulatory Visit: Payer: Medicare HMO | Admitting: Cardiovascular Disease

## 2022-04-13 DIAGNOSIS — R931 Abnormal findings on diagnostic imaging of heart and coronary circulation: Secondary | ICD-10-CM

## 2022-04-13 DIAGNOSIS — E782 Mixed hyperlipidemia: Secondary | ICD-10-CM

## 2022-05-06 ENCOUNTER — Encounter: Payer: Self-pay | Admitting: Pulmonary Disease

## 2022-05-06 ENCOUNTER — Ambulatory Visit: Payer: Medicare HMO | Admitting: Pulmonary Disease

## 2022-05-06 VITALS — BP 120/70 | HR 68 | Ht 66.0 in | Wt 128.0 lb

## 2022-05-06 DIAGNOSIS — R0602 Shortness of breath: Secondary | ICD-10-CM | POA: Diagnosis not present

## 2022-05-06 DIAGNOSIS — F17210 Nicotine dependence, cigarettes, uncomplicated: Secondary | ICD-10-CM | POA: Diagnosis not present

## 2022-05-06 DIAGNOSIS — J449 Chronic obstructive pulmonary disease, unspecified: Secondary | ICD-10-CM | POA: Diagnosis not present

## 2022-05-06 DIAGNOSIS — R634 Abnormal weight loss: Secondary | ICD-10-CM

## 2022-05-06 DIAGNOSIS — R5383 Other fatigue: Secondary | ICD-10-CM

## 2022-05-06 NOTE — Patient Instructions (Addendum)
Continue to use Breztri inhaler 2 puffs twice daily - rinse mouth out after each use  Congratulations on quitting smoking.  Recommend that your primary care doctor check Vitamin D and B12 levels when they check your lab work next.   We will check CT Chest, Abdomen and Pelvis to rule out reasons for your weight loss and fatigue.   We will check a home sleep study for your fatigue  Recommend drinking boost or ensure protein drinks 1 to 2 times per day on top of your normal meals/snacks.  Follow up in 6 weeks
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
## 2022-05-07 ENCOUNTER — Encounter: Payer: Self-pay | Admitting: Pulmonary Disease

## 2022-05-12 ENCOUNTER — Telehealth: Payer: Self-pay | Admitting: Pulmonary Disease

## 2022-05-12 NOTE — Telephone Encounter (Signed)
Pt. Sister calling when he saw Dr. Erin Fulling and spoke about get lighter portable machine for hime but I see not request in system about this or a order

## 2022-05-13 NOTE — Telephone Encounter (Signed)
Called and spoke to pt. I informed pt to get qualified for a POC we would have to walk him. Pt verbalized understanding. I set him up for a 6 minute walk 05/19/22. Nothing further needed

## 2022-05-14 ENCOUNTER — Ambulatory Visit (INDEPENDENT_AMBULATORY_CARE_PROVIDER_SITE_OTHER): Payer: Medicare HMO | Admitting: Pulmonary Disease

## 2022-05-14 DIAGNOSIS — J449 Chronic obstructive pulmonary disease, unspecified: Secondary | ICD-10-CM

## 2022-05-14 NOTE — Progress Notes (Signed)
Six Minute Walk - 05/14/22 1604       Six Minute Walk   Supplemental oxygen during test? No    Lap distance in meters  34 meters    Laps Completed 10    Partial lap (in meters) 10 meters    Baseline BP (sitting) 126/82    Baseline Heartrate 57    Baseline Dyspnea (Borg Scale) 3    Baseline Fatigue (Borg Scale) 4    Baseline SPO2 94 %      End of Test Values    BP (sitting) 128/74    Heartrate 59    Dyspnea (Borg Scale) 3    Fatigue (Borg Scale) 6    SPO2 94 %      2 Minutes Post Walk Values   BP (sitting) 124/72    Heartrate 58    SPO2 97 %    Stopped or paused before six minutes? No      Interpretation   Distance completed 350 meters    Tech Comments: Patient was able to complete entire 6 min walk without stopping. He walked at a steady pace. He did state that he had some chest tightness and increased SOB after the walk. No O2 needed during or after walk.

## 2022-05-19 ENCOUNTER — Ambulatory Visit: Payer: Medicare HMO
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
## 2022-05-25 ENCOUNTER — Ambulatory Visit: Payer: Medicare HMO | Attending: Cardiovascular Disease | Admitting: Cardiovascular Disease

## 2022-05-25 ENCOUNTER — Encounter: Payer: Self-pay | Admitting: Cardiovascular Disease

## 2022-05-25 VITALS — BP 120/68 | HR 60 | Ht 66.0 in | Wt 122.0 lb

## 2022-05-25 DIAGNOSIS — R072 Precordial pain: Secondary | ICD-10-CM | POA: Diagnosis not present

## 2022-05-25 DIAGNOSIS — R0602 Shortness of breath: Secondary | ICD-10-CM | POA: Diagnosis not present

## 2022-05-25 DIAGNOSIS — R931 Abnormal findings on diagnostic imaging of heart and coronary circulation: Secondary | ICD-10-CM

## 2022-05-25 DIAGNOSIS — E782 Mixed hyperlipidemia: Secondary | ICD-10-CM

## 2022-05-25 MED ORDER — ATORVASTATIN CALCIUM 20 MG PO TABS: 20.0000 mg | ORAL_TABLET | Freq: Every day | ORAL | 3 refills | Status: DC

## 2022-05-25 NOTE — Patient Instructions (Addendum)
Medication Instructions:  START Lipitor 20 mg daily   *If you need a refill on your cardiac medications before your next appointment, please call your pharmacy*   Follow-Up: At [facility], you and your health needs are our priority.  As part of our continuing mission to provide you with exceptional heart care, we have created designated Provider Care Teams.  These Care Teams include your primary Cardiologist (physician) and Advanced Practice Providers (APPs -  Physician Assistants and Nurse Practitioners) who all work together to provide you with the care you need, when you need it.  We recommend signing up for the patient portal called "MyChart".  Sign up information is provided on this After Visit Summary.  MyChart is used to connect with patients for Virtual Visits (Telemedicine).  Patients are able to view lab/test results, encounter notes, upcoming appointments, etc.  Non-urgent messages can be sent to your provider as well.   To learn more about what you can do with MyChart, go to [email or web address].    Your next appointment:   12 month(s)  Provider:   Amedeo Plenty, Marjie Skiff, PA-C, or Bernadene Person, NP

## 2022-05-28 ENCOUNTER — Other Ambulatory Visit: Payer: Medicare HMO

## 2022-05-28 ENCOUNTER — Ambulatory Visit
Admission: RE | Admit: 2022-05-28 | Discharge: 2022-05-28 | Disposition: A | Discharge status: Home / Self Care | Payer: Medicare HMO | Source: Ambulatory Visit | Attending: Pulmonary Disease | Admitting: Pulmonary Disease

## 2022-05-28 ENCOUNTER — Inpatient Hospital Stay: Admission: RE | Admit: 2022-05-28 | Payer: Medicare HMO | Source: Ambulatory Visit

## 2022-05-28 ENCOUNTER — Other Ambulatory Visit: Payer: Self-pay | Admitting: Pulmonary Disease

## 2022-05-28 DIAGNOSIS — R0602 Shortness of breath: Secondary | ICD-10-CM

## 2022-05-28 DIAGNOSIS — R5383 Other fatigue: Secondary | ICD-10-CM

## 2022-05-28 DIAGNOSIS — F17210 Nicotine dependence, cigarettes, uncomplicated: Secondary | ICD-10-CM

## 2022-05-28 DIAGNOSIS — R634 Abnormal weight loss: Secondary | ICD-10-CM

## 2022-05-28 DIAGNOSIS — J449 Chronic obstructive pulmonary disease, unspecified: Secondary | ICD-10-CM

## 2022-05-28 MED ORDER — IOPAMIDOL (ISOVUE-370) INJECTION 76%
80.0000 mL | Freq: Once | INTRAVENOUS | Status: AC | PRN
  Administered 2022-05-28: 80 mL via INTRAVENOUS

## 2022-05-31 ENCOUNTER — Telehealth: Payer: Self-pay | Admitting: Pulmonary Disease

## 2022-05-31 DIAGNOSIS — R634 Abnormal weight loss: Secondary | ICD-10-CM

## 2022-05-31 NOTE — Telephone Encounter (Signed)
Please let patient know that his CT scans show thickening of the stomach which could be leading to his weight loss. I recommend an urgent consult to GI be placed for further evaluation.   The CT chest shows severe emphysema.  Thanks,  Cletis Athens

## 2022-06-01 ENCOUNTER — Encounter: Payer: Self-pay | Admitting: Gastroenterology

## 2022-06-01 NOTE — Telephone Encounter (Signed)
Went over CT results with patient. He verbalized understanding. NFN

## 2022-06-11 ENCOUNTER — Telehealth: Payer: Self-pay | Admitting: Pulmonary Disease

## 2022-06-11 NOTE — Telephone Encounter (Signed)
Shelia sister states patient needs portable oxygen concentrator. Shelia phone number is [phone].

## 2022-06-11 NOTE — Telephone Encounter (Signed)
ATC X1 LVM for patient to call the office back, sister is not on DPR. Patient completed back in march. He did not qualify for POC- pt never required o2 during the walk.

## 2022-06-12 NOTE — Telephone Encounter (Signed)
Pt. Called back and told him he does not qualify for the the Portable concentrator and that he is welcome to come back and re do test if he like but nothing further

## 2022-06-22 ENCOUNTER — Ambulatory Visit: Payer: Medicare HMO | Admitting: Pulmonary Disease
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
## 2022-07-03 ENCOUNTER — Encounter: Payer: Self-pay | Admitting: Family Medicine

## 2022-07-03 ENCOUNTER — Ambulatory Visit (INDEPENDENT_AMBULATORY_CARE_PROVIDER_SITE_OTHER): Payer: Medicare HMO | Admitting: Family Medicine

## 2022-07-03 VITALS — BP 112/69 | HR 64 | Temp 97.8°F | Ht 66.0 in | Wt 127.8 lb

## 2022-07-03 DIAGNOSIS — M545 Low back pain, unspecified: Secondary | ICD-10-CM | POA: Diagnosis not present

## 2022-07-03 DIAGNOSIS — E782 Mixed hyperlipidemia: Secondary | ICD-10-CM | POA: Diagnosis not present

## 2022-07-03 DIAGNOSIS — H6123 Impacted cerumen, bilateral: Secondary | ICD-10-CM

## 2022-07-03 DIAGNOSIS — R61 Generalized hyperhidrosis: Secondary | ICD-10-CM

## 2022-07-03 DIAGNOSIS — F5104 Psychophysiologic insomnia: Secondary | ICD-10-CM

## 2022-07-03 DIAGNOSIS — K219 Gastro-esophageal reflux disease without esophagitis: Secondary | ICD-10-CM

## 2022-07-03 DIAGNOSIS — R5383 Other fatigue: Secondary | ICD-10-CM

## 2022-07-03 DIAGNOSIS — Z79891 Long term (current) use of opiate analgesic: Secondary | ICD-10-CM | POA: Diagnosis not present

## 2022-07-03 DIAGNOSIS — J449 Chronic obstructive pulmonary disease, unspecified: Secondary | ICD-10-CM | POA: Diagnosis not present

## 2022-07-03 DIAGNOSIS — G8929 Other chronic pain: Secondary | ICD-10-CM

## 2022-07-03 LAB — URINALYSIS, ROUTINE W REFLEX MICROSCOPIC
Bilirubin, UA: NEGATIVE
Glucose, UA: NEGATIVE
Ketones, UA: NEGATIVE
Leukocytes,UA: NEGATIVE
Nitrite, UA: NEGATIVE
Protein,UA: NEGATIVE
RBC, UA: NEGATIVE
Specific Gravity, UA: 1.01 (ref 1.005–1.030)
Urobilinogen, Ur: 0.2 mg/dL (ref 0.2–1.0)
pH, UA: 7 (ref 5.0–7.5)

## 2022-07-03 MED ORDER — PANTOPRAZOLE SODIUM 40 MG PO TBEC: 40.0000 mg | DELAYED_RELEASE_TABLET | Freq: Two times a day (BID) | ORAL | 1 refills | Status: DC

## 2022-07-03 MED ORDER — DEBROX 6.5 % OT SOLN: 5.0000 [drp] | Freq: Two times a day (BID) | OTIC | 0 refills | Status: AC

## 2022-07-03 MED ORDER — TRAZODONE HCL 150 MG PO TABS: 150.0000 mg | ORAL_TABLET | Freq: Every day | ORAL | 1 refills | Status: DC
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
## 2022-07-06 ENCOUNTER — Other Ambulatory Visit: Payer: Self-pay | Admitting: Family Medicine

## 2022-07-08 LAB — CMP14+EGFR
ALT: 14 IU/L (ref 0–44)
AST: 21 IU/L (ref 0–40)
Albumin/Globulin Ratio: 2.2 (ref 1.2–2.2)
Albumin: 4.2 g/dL (ref 3.9–4.9)
Alkaline Phosphatase: 92 IU/L (ref 44–121)
BUN/Creatinine Ratio: 11 (ref 10–24)
BUN: 9 mg/dL (ref 8–27)
Bilirubin Total: 0.2 mg/dL (ref 0.0–1.2)
CO2: 26 mmol/L (ref 20–29)
Calcium: 9.5 mg/dL (ref 8.6–10.2)
Chloride: 100 mmol/L (ref 96–106)
Creatinine, Ser: 0.82 mg/dL (ref 0.76–1.27)
Globulin, Total: 1.9 g/dL (ref 1.5–4.5)
Glucose: 80 mg/dL (ref 70–99)
Potassium: 4.2 mmol/L (ref 3.5–5.2)
Sodium: 141 mmol/L (ref 134–144)
Total Protein: 6.1 g/dL (ref 6.0–8.5)
eGFR: 97 mL/min/{1.73_m2} (ref >59)

## 2022-07-08 LAB — ANEMIA PROFILE B
Basophils Absolute: 0 10*3/uL (ref 0.0–0.2)
Basos: 1 %
EOS (ABSOLUTE): 0.2 10*3/uL (ref 0.0–0.4)
Eos: 3 %
Ferritin: 24 ng/mL — ABNORMAL LOW (ref 30–400)
Folate: >20 ng/mL (ref >3.0)
Hematocrit: 40.5 % (ref 37.5–51.0)
Hemoglobin: 13.2 g/dL (ref 13.0–17.7)
Immature Grans (Abs): 0 10*3/uL (ref 0.0–0.1)
Immature Granulocytes: 0 %
Iron Saturation: 17 % (ref 15–55)
Iron: 60 ug/dL (ref 38–169)
Lymphocytes Absolute: 1.6 10*3/uL (ref 0.7–3.1)
Lymphs: 26 %
MCH: 29.5 pg (ref 26.6–33.0)
MCHC: 32.6 g/dL (ref 31.5–35.7)
MCV: 90 fL (ref 79–97)
Monocytes Absolute: 0.4 10*3/uL (ref 0.1–0.9)
Monocytes: 7 %
Neutrophils Absolute: 3.7 10*3/uL (ref 1.4–7.0)
Neutrophils: 63 %
Platelets: 196 10*3/uL (ref 150–450)
RBC: 4.48 x10E6/uL (ref 4.14–5.80)
RDW: 15.3 % (ref 11.6–15.4)
Retic Ct Pct: 1.1 % (ref 0.6–2.6)
Total Iron Binding Capacity: 347 ug/dL (ref 250–450)
UIBC: 287 ug/dL (ref 111–343)
Vitamin B-12: 1374 pg/mL — ABNORMAL HIGH (ref 232–1245)
WBC: 5.9 10*3/uL (ref 3.4–10.8)

## 2022-07-08 LAB — HEPATITIS C ANTIBODY: Hep C Virus Ab: NONREACTIVE

## 2022-07-08 LAB — VITAMIN B5: Vitamin B5: 87.2 ng/mL (ref 12.9–253.1)

## 2022-07-08 LAB — C-REACTIVE PROTEIN: CRP: <1 mg/L (ref 0–10)

## 2022-07-08 LAB — TSH: TSH: 2.06 u[IU]/mL (ref 0.450–4.500)

## 2022-07-08 LAB — HIV ANTIBODY (ROUTINE TESTING W REFLEX): HIV Screen 4th Generation wRfx: NONREACTIVE

## 2022-07-08 LAB — VITAMIN D 25 HYDROXY (VIT D DEFICIENCY, FRACTURES): Vit D, 25-Hydroxy: 71 ng/mL (ref 30.0–100.0)

## 2022-07-09 ENCOUNTER — Encounter: Payer: Self-pay | Admitting: Pulmonary Disease

## 2022-07-15 ENCOUNTER — Telehealth: Payer: Self-pay | Admitting: Family Medicine

## 2022-07-15 DIAGNOSIS — F419 Anxiety disorder, unspecified: Secondary | ICD-10-CM

## 2022-07-15 MED ORDER — HYDROXYZINE HCL 25 MG PO TABS: 25.0000 mg | ORAL_TABLET | Freq: Four times a day (QID) | ORAL | 0 refills | Status: DC | PRN

## 2022-07-15 NOTE — Telephone Encounter (Signed)
Pts trazadone was recently increased and since then pt has been having the shakes and not able to sleep.

## 2022-07-15 NOTE — Telephone Encounter (Signed)
Patient aware.

## 2022-07-15 NOTE — Telephone Encounter (Signed)
Hydroxyzine sent in

## 2022-07-15 NOTE — Addendum Note (Signed)
Addended by: Gabriel Earing on: 07/15/2022 01:59 PM   Modules accepted: Orders

## 2022-07-15 NOTE — Telephone Encounter (Signed)
Decrease back to previous dose

## 2022-07-15 NOTE — Telephone Encounter (Signed)
I called and spoke to pt and he was confused-he was taking Trazadone for anxiety during the day and not at night. He had some hydroxyzine from previous provider but doesn't have any and wanted to know if you would send those in for his nerves. I advised pt he may ntbs to discuss but pt wanted to see if you would just send it in. Please advise.

## 2022-07-17 ENCOUNTER — Other Ambulatory Visit: Payer: Self-pay | Admitting: Family Medicine

## 2022-07-17 DIAGNOSIS — F419 Anxiety disorder, unspecified: Secondary | ICD-10-CM

## 2022-07-22 ENCOUNTER — Ambulatory Visit (INDEPENDENT_AMBULATORY_CARE_PROVIDER_SITE_OTHER): Payer: Medicare HMO

## 2022-07-22 ENCOUNTER — Telehealth: Payer: Self-pay | Admitting: Family Medicine

## 2022-07-22 VITALS — Ht 66.0 in | Wt 128.0 lb

## 2022-07-22 DIAGNOSIS — Z0000 Encounter for general adult medical examination without abnormal findings: Secondary | ICD-10-CM | POA: Diagnosis not present

## 2022-07-22 DIAGNOSIS — F419 Anxiety disorder, unspecified: Secondary | ICD-10-CM

## 2022-07-22 NOTE — Patient Instructions (Signed)
Jeffrey Mendez , Thank you for taking time to come for your Medicare Wellness Visit. I appreciate your ongoing commitment to your health goals. Please review the following plan we discussed and let me know if I can assist you in the future.   These are the goals we discussed:  Goals      DIET - INCREASE WATER INTAKE        This is a list of the screening recommended for you and due dates:  Health Maintenance  Topic Date Due   COVID-19 Vaccine (1) Never done   Pneumonia Vaccine (1 of 2 - PCV) [birth year]14/1962   DTaP/Tdap/Td vaccine (1 - Tdap) Never done   Zoster (Shingles) Vaccine (1 of 2) Never done   Colon Cancer Screening  08/11/2016   Flu Shot  09/17/2022   Screening for Lung Cancer  05/28/2023   Medicare Annual Wellness Visit  07/22/2023   Hepatitis C Screening  Completed   HIV Screening  Completed   HPV Vaccine  Aged Out    Advanced directives: Advance directive discussed with you today. I have provided a copy for you to complete at home and have notarized. Once this is complete please bring a copy in to our office so we can scan it into your chart.   Conditions/risks identified: Aim for 30 minutes of exercise or brisk walking, 6-8 glasses of water, and 5 servings of fruits and vegetables each day.   Next appointment: Follow up in one year for your annual wellness visit.   Preventive Care 17 Years and Older, Male  Preventive care refers to lifestyle choices and visits with your health care provider that can promote health and wellness. What does preventive care include? A yearly physical exam. This is also called an annual well check. Dental exams once or twice a year. Routine eye exams. Ask your health care provider how often you should have your eyes checked. Personal lifestyle choices, including: Daily care of your teeth and gums. Regular physical activity. Eating a healthy diet. Avoiding tobacco and drug use. Limiting alcohol use. Practicing safe sex. Taking low doses of  aspirin every day. Taking vitamin and mineral supplements as recommended by your health care provider. What happens during an annual well check? The services and screenings done by your health care provider during your annual well check will depend on your age, overall health, lifestyle risk factors, and family history of disease. Counseling  Your health care provider may ask you questions about your: Alcohol use. Tobacco use. Drug use. Emotional well-being. Home and relationship well-being. Sexual activity. Eating habits. History of falls. Memory and ability to understand (cognition). Work and work Astronomer. Screening  You may have the following tests or measurements: Height, weight, and BMI. Blood pressure. Lipid and cholesterol levels. These may be checked every 5 years, or more frequently if you are over 63 years old. Skin check. Lung cancer screening. You may have this screening every year starting at age 29 if you have a 30-pack-year history of smoking and currently smoke or have quit within the past 15 years. Fecal occult blood test (FOBT) of the stool. You may have this test every year starting at age 51. Flexible sigmoidoscopy or colonoscopy. You may have a sigmoidoscopy every 5 years or a colonoscopy every 10 years starting at age 66. Prostate cancer screening. Recommendations will vary depending on your family history and other risks. Hepatitis C blood test. Hepatitis B blood test. Sexually transmitted disease (STD) testing. Diabetes screening. This is  done by checking your blood sugar (glucose) after you have not eaten for a while (fasting). You may have this done every 1-3 years. Abdominal aortic aneurysm (AAA) screening. You may need this if you are a current or former smoker. Osteoporosis. You may be screened starting at age 93 if you are at high risk. Talk with your health care provider about your test results, treatment options, and if necessary, the need for more  tests. Vaccines  Your health care provider may recommend certain vaccines, such as: Influenza vaccine. This is recommended every year. Tetanus, diphtheria, and acellular pertussis (Tdap, Td) vaccine. You may need a Td booster every 10 years. Zoster vaccine. You may need this after age 62. Pneumococcal 13-valent conjugate (PCV13) vaccine. One dose is recommended after age 32. Pneumococcal polysaccharide (PPSV23) vaccine. One dose is recommended after age 48. Talk to your health care provider about which screenings and vaccines you need and how often you need them. This information is not intended to replace advice given to you by your health care provider. Make sure you discuss any questions you have with your health care provider. Document Released: 03/01/2015 Document Revised: 10/23/2015 Document Reviewed: 12/04/2014 Elsevier Interactive Patient Education  2017 [employer].  Fall Prevention in the Home Falls can cause injuries. They can happen to people of all ages. There are many things you can do to make your home safe and to help prevent falls. What can I do on the outside of my home? Regularly fix the edges of walkways and driveways and fix any cracks. Remove anything that might make you trip as you walk through a door, such as a raised step or threshold. Trim any bushes or trees on the path to your home. Use bright outdoor lighting. Clear any walking paths of anything that might make someone trip, such as rocks or tools. Regularly check to see if handrails are loose or broken. Make sure that both sides of any steps have handrails. Any raised decks and porches should have guardrails on the edges. Have any leaves, snow, or ice cleared regularly. Use sand or salt on walking paths during winter. Clean up any spills in your garage right away. This includes oil or grease spills. What can I do in the bathroom? Use night lights. Install grab bars by the toilet and in the tub and shower.  Do not use towel bars as grab bars. Use non-skid mats or decals in the tub or shower. If you need to sit down in the shower, use a plastic, non-slip stool. Keep the floor dry. Clean up any water that spills on the floor as soon as it happens. Remove soap buildup in the tub or shower regularly. Attach bath mats securely with double-sided non-slip rug tape. Do not have throw rugs and other things on the floor that can make you trip. What can I do in the bedroom? Use night lights. Make sure that you have a light by your bed that is easy to reach. Do not use any sheets or blankets that are too big for your bed. They should not hang down onto the floor. Have a firm chair that has side arms. You can use this for support while you get dressed. Do not have throw rugs and other things on the floor that can make you trip. What can I do in the kitchen? Clean up any spills right away. Avoid walking on wet floors. Keep items that you use a lot in easy-to-reach places. If you need  to reach something above you, use a strong step stool that has a grab bar. Keep electrical cords out of the way. Do not use floor polish or wax that makes floors slippery. If you must use wax, use non-skid floor wax. Do not have throw rugs and other things on the floor that can make you trip. What can I do with my stairs? Do not leave any items on the stairs. Make sure that there are handrails on both sides of the stairs and use them. Fix handrails that are broken or loose. Make sure that handrails are as long as the stairways. Check any carpeting to make sure that it is firmly attached to the stairs. Fix any carpet that is loose or worn. Avoid having throw rugs at the top or bottom of the stairs. If you do have throw rugs, attach them to the floor with carpet tape. Make sure that you have a light switch at the top of the stairs and the bottom of the stairs. If you do not have them, ask someone to add them for you. What else  can I do to help prevent falls? Wear shoes that: Do not have high heels. Have rubber bottoms. Are comfortable and fit you well. Are closed at the toe. Do not wear sandals. If you use a stepladder: Make sure that it is fully opened. Do not climb a closed stepladder. Make sure that both sides of the stepladder are locked into place. Ask someone to hold it for you, if possible. Clearly [place] and make sure that you can see: Any grab bars or handrails. First and last steps. Where the edge of each step is. Use tools that help you move around (mobility aids) if they are needed. These include: Canes. Walkers. Scooters. Crutches. Turn on the lights when you go into a dark area. Replace any light bulbs as soon as they burn out. Set up your furniture so you have a clear path. Avoid moving your furniture around. If any of your floors are uneven, fix them. If there are any pets around you, be aware of where they are. Review your medicines with your doctor. Some medicines can make you feel dizzy. This can increase your chance of falling. Ask your doctor what other things that you can do to help prevent falls. This information is not intended to replace advice given to you by your health care provider. Make sure you discuss any questions you have with your health care provider. Document Released: 11/29/2008 Document Revised: 07/11/2015 Document Reviewed: 03/09/2014 Elsevier Interactive Patient Education  2017 [employer].
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
## 2022-07-22 NOTE — Telephone Encounter (Signed)
I recommend starting a SSRI daily to help control anxiety. Is he agreeable?

## 2022-07-22 NOTE — Telephone Encounter (Signed)
Patient reports that hydroxyzine is not helping, he said it is having the opposite effect on him, instead of calming him down it is making him nervous and anxious.  A few times he has broken up the Trazodone and taken a small piece of it which patient reports helps.  I explained to him he is only supposed to be taking the Trazodone at bedtime and he states he is afraid of taking the whole pill because he is afraid it will make him feel groggy in the morning.  I explained to him that Trazodone should not do that and he should take the whole tablet only at bedtime.  Patient wants to know what you recommend he do for anxiety during the day.

## 2022-07-23 MED ORDER — SERTRALINE HCL 25 MG PO TABS: 25.0000 mg | ORAL_TABLET | Freq: Every day | ORAL | 3 refills | Status: DC

## 2022-07-23 NOTE — Telephone Encounter (Signed)
Called and spoke w/pt and per pt 'yes' would like to have some meds to help with anxiety.  Pls advice

## 2022-07-23 NOTE — Addendum Note (Signed)
Addended by: Gabriel Earing on: 07/23/2022 03:45 PM   Modules accepted: Orders

## 2022-07-23 NOTE — Telephone Encounter (Signed)
I've sent in a low dose of zoloft for him to start. Have him follow up with me in 6 weeks.

## 2022-07-27 ENCOUNTER — Ambulatory Visit: Payer: Medicare HMO | Admitting: Gastroenterology

## 2022-07-27 ENCOUNTER — Encounter: Payer: Self-pay | Admitting: Gastroenterology

## 2022-07-27 VITALS — BP 118/70 | HR 48 | Ht 66.0 in | Wt 127.0 lb

## 2022-07-27 DIAGNOSIS — R634 Abnormal weight loss: Secondary | ICD-10-CM | POA: Diagnosis not present

## 2022-07-27 DIAGNOSIS — R9389 Abnormal findings on diagnostic imaging of other specified body structures: Secondary | ICD-10-CM

## 2022-07-27 DIAGNOSIS — K5900 Constipation, unspecified: Secondary | ICD-10-CM

## 2022-07-27 DIAGNOSIS — R1319 Other dysphagia: Secondary | ICD-10-CM

## 2022-07-27 DIAGNOSIS — R933 Abnormal findings on diagnostic imaging of other parts of digestive tract: Secondary | ICD-10-CM
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
## 2022-07-27 NOTE — Patient Instructions (Signed)
_______________________________________________________  If your blood pressure at your visit was 140/90 or greater, please contact your primary care physician to follow up on this.  _______________________________________________________  If you are age 66 or older, your body mass index should be between 23-30. Your Body mass index is 20.5 kg/m. If this is out of the aforementioned range listed, please consider follow up with your Primary Care Provider.  If you are age 24 or younger, your body mass index should be between 19-25. Your Body mass index is 20.5 kg/m. If this is out of the aformentioned range listed, please consider follow up with your Primary Care Provider.   Take Miralax daily. You may increase as needed.  You may also start a fiber supplement such as benefiber or metamucil.   The [facility] providers would like to encourage you to use [facility] to communicate with providers for non-urgent requests or questions.  Due to long hold times on the telephone, sending your provider a message by [facility] may be a faster and more efficient way to get a response.  Please allow 48 business hours for a response.  Please remember that this is for non-urgent requests.   It was a pleasure to see you today!  Thank you for trusting me with your gastrointestinal care!

## 2022-07-29 ENCOUNTER — Encounter: Payer: Self-pay | Admitting: Pulmonary Disease

## 2022-07-29 ENCOUNTER — Ambulatory Visit: Payer: Medicare HMO | Admitting: Pulmonary Disease

## 2022-07-29 VITALS — BP 110/74 | HR 58 | Ht 66.0 in | Wt 127.4 lb

## 2022-07-29 DIAGNOSIS — J449 Chronic obstructive pulmonary disease, unspecified: Secondary | ICD-10-CM

## 2022-07-29 NOTE — Patient Instructions (Signed)
Continue breztri inhaler 2 puffs twice daily  Use as needed nebulizer treatments  Consider mirtazapine for anxiety/depression, can discuss further with your primary care team  Follow up in 1 year, call sooner if needed
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
## 2022-08-05 DIAGNOSIS — E041 Nontoxic single thyroid nodule: Secondary | ICD-10-CM | POA: Insufficient documentation

## 2022-08-06 ENCOUNTER — Other Ambulatory Visit: Payer: Self-pay

## 2022-08-06 ENCOUNTER — Telehealth: Payer: Self-pay

## 2022-08-06 DIAGNOSIS — R1319 Other dysphagia: Secondary | ICD-10-CM

## 2022-08-06 DIAGNOSIS — R634 Abnormal weight loss: Secondary | ICD-10-CM

## 2022-08-06 NOTE — Telephone Encounter (Signed)
Left VM for patient to return call regarding hospital opening on 08/18/22.

## 2022-08-14 ENCOUNTER — Telehealth: Payer: Self-pay | Admitting: *Deleted

## 2022-08-14 DIAGNOSIS — F419 Anxiety disorder, unspecified: Secondary | ICD-10-CM

## 2022-08-14 NOTE — Telephone Encounter (Signed)
Only taking Zoloft, shaky nerves start when he gets up, shaky all over, can't stop, walking floor at night not sleeping. Stopped hydroxyzine couple weeks ago, no trazodone since yesterday.  Please advise

## 2022-08-14 NOTE — Telephone Encounter (Signed)
What does of zoloft is he taking? It is no longer on his list. Is he taking zoloft daily?  Is he agreeable to [facility] referral?

## 2022-08-14 NOTE — Telephone Encounter (Signed)
Called and spoke w/pt and per pt he is not taking the Zoloft, either, it doesn't make him feel good. Per pt has only took and pill and stopped. Per pt does not want [facility].  Pls advise

## 2022-08-17 MED ORDER — BUSPIRONE HCL 5 MG PO TABS
5.0000 mg | ORAL_TABLET | Freq: Two times a day (BID) | ORAL | 1 refills | Status: DC
Start: 2022-08-17 — End: 2023-01-11

## 2022-08-17 NOTE — Telephone Encounter (Signed)
Buspar sent instead.

## 2022-08-17 NOTE — Addendum Note (Signed)
Addended by: Gabriel Earing on: 08/17/2022 10:14 AM   Modules accepted: Orders

## 2022-08-18 ENCOUNTER — Ambulatory Visit (HOSPITAL_COMMUNITY): Admission: RE | Admit: 2022-08-18 | Payer: Medicare HMO | Source: Home / Self Care | Admitting: Gastroenterology

## 2022-08-18 ENCOUNTER — Encounter (HOSPITAL_COMMUNITY): Admission: RE | Payer: Self-pay | Source: Home / Self Care

## 2022-08-18 SURGERY — ESOPHAGOGASTRODUODENOSCOPY (EGD) WITH PROPOFOL
Anesthesia: Monitor Anesthesia Care

## 2022-08-19 ENCOUNTER — Telehealth: Payer: Self-pay

## 2022-08-19 NOTE — Telephone Encounter (Signed)
Left VM for patient to return call regarding hospital dates.

## 2022-09-09 ENCOUNTER — Emergency Department (HOSPITAL_COMMUNITY): Payer: Medicare HMO

## 2022-09-09 ENCOUNTER — Encounter (HOSPITAL_COMMUNITY): Payer: Self-pay | Admitting: Emergency Medicine

## 2022-09-09 ENCOUNTER — Other Ambulatory Visit: Payer: Self-pay

## 2022-09-09 ENCOUNTER — Emergency Department (HOSPITAL_COMMUNITY)
Admission: EM | Admit: 2022-09-09 | Discharge: 2022-09-09 | Disposition: A | Discharge status: Home / Self Care | Payer: Medicare HMO | Source: Home / Self Care | Attending: Emergency Medicine | Admitting: Emergency Medicine

## 2022-09-09 DIAGNOSIS — Z7982 Long term (current) use of aspirin: Secondary | ICD-10-CM | POA: Insufficient documentation

## 2022-09-09 DIAGNOSIS — R0789 Other chest pain: Secondary | ICD-10-CM | POA: Insufficient documentation

## 2022-09-09 DIAGNOSIS — I729 Aneurysm of unspecified site: Secondary | ICD-10-CM | POA: Diagnosis not present

## 2022-09-09 DIAGNOSIS — F419 Anxiety disorder, unspecified: Secondary | ICD-10-CM | POA: Insufficient documentation

## 2022-09-09 DIAGNOSIS — R638 Other symptoms and signs concerning food and fluid intake: Secondary | ICD-10-CM | POA: Insufficient documentation

## 2022-09-09 DIAGNOSIS — Z7951 Long term (current) use of inhaled steroids: Secondary | ICD-10-CM | POA: Insufficient documentation

## 2022-09-09 DIAGNOSIS — I671 Cerebral aneurysm, nonruptured: Secondary | ICD-10-CM | POA: Diagnosis not present

## 2022-09-09 DIAGNOSIS — J449 Chronic obstructive pulmonary disease, unspecified: Secondary | ICD-10-CM | POA: Insufficient documentation

## 2022-09-09 DIAGNOSIS — F1193 Opioid use, unspecified with withdrawal: Secondary | ICD-10-CM | POA: Insufficient documentation

## 2022-09-09 LAB — URINALYSIS, ROUTINE W REFLEX MICROSCOPIC
Bacteria, UA: NONE SEEN
Bilirubin Urine: NEGATIVE
Glucose, UA: NEGATIVE mg/dL
Ketones, ur: 5 mg/dL — AB
Leukocytes,Ua: NEGATIVE
Nitrite: NEGATIVE
Protein, ur: NEGATIVE mg/dL
Specific Gravity, Urine: 1.011 (ref 1.005–1.030)
pH: 7 (ref 5.0–8.0)

## 2022-09-09 LAB — CBC
HCT: 41.6 % (ref 39.0–52.0)
Hemoglobin: 14 g/dL (ref 13.0–17.0)
MCH: 30.8 pg (ref 26.0–34.0)
MCHC: 33.7 g/dL (ref 30.0–36.0)
MCV: 91.4 fL (ref 80.0–100.0)
Platelets: 151 10*3/uL (ref 150–400)
RBC: 4.55 MIL/uL (ref 4.22–5.81)
RDW: 13.2 % (ref 11.5–15.5)
WBC: 4 10*3/uL (ref 4.0–10.5)
nRBC: 0 % (ref 0.0–0.2)

## 2022-09-09 LAB — TROPONIN I (HIGH SENSITIVITY)
Troponin I (High Sensitivity): 5 ng/L
Troponin I (High Sensitivity): 4 ng/L

## 2022-09-09 LAB — BASIC METABOLIC PANEL
Anion gap: 6 (ref 5–15)
BUN: 12 mg/dL (ref 8–23)
CO2: 28 mmol/L (ref 22–32)
Calcium: 9 mg/dL (ref 8.9–10.3)
Chloride: 104 mmol/L (ref 98–111)
Creatinine, Ser: 0.85 mg/dL (ref 0.61–1.24)
GFR, Estimated: >60 mL/min (ref >60)
Glucose, Bld: 98 mg/dL (ref 70–99)
Potassium: 4 mmol/L (ref 3.5–5.1)
Sodium: 138 mmol/L (ref 135–145)

## 2022-09-09 MED ORDER — DIPHENHYDRAMINE HCL 50 MG/ML IJ SOLN
12.5000 mg | Freq: Once | INTRAMUSCULAR | Status: AC
  Administered 2022-09-09: 12.5 mg via INTRAVENOUS
  Filled 2022-09-09: qty 1

## 2022-09-09 MED ORDER — LORAZEPAM 1 MG PO TABS
1.0000 mg | ORAL_TABLET | Freq: Once | ORAL | Status: AC
  Administered 2022-09-09: 1 mg via ORAL
  Filled 2022-09-09: qty 1

## 2022-09-09 MED ORDER — METHOCARBAMOL 500 MG PO TABS: 500.0000 mg | ORAL_TABLET | Freq: Three times a day (TID) | ORAL | 0 refills | Status: DC

## 2022-09-09 MED ORDER — SODIUM CHLORIDE 0.9 % IV BOLUS
500.0000 mL | Freq: Once | INTRAVENOUS | Status: AC
  Administered 2022-09-09: 500 mL via INTRAVENOUS

## 2022-09-09 NOTE — Discharge Instructions (Addendum)
Recommend that you take your pain medication as directed to avoid withdrawal symptoms.  Please call your pain management provider today or tomorrow to arrange follow-up appointment.  Return to the emergency department for any new or worsening symptoms.

## 2022-09-09 NOTE — ED Notes (Signed)
Pt unable to urinate at this time.  

## 2022-09-09 NOTE — ED Triage Notes (Signed)
Pt bib [facility], pt stated he has been anxious with chest pain x 2 weeks. States has been taking morphine x 25 years for back pain with oxy for break through pain. Pt received ns in route. Cbg 107 in route. Pt states about a month ago the meds started making him have the shakes and cold chills when his Dr took away some of his oxy. Pt also c/o dark urine and odor to urine. Pt c/o chest pain to both sides that is intermittent x 2 weeks. Denies shob/dizzy. Color wnl. Non diaphoretic.

## 2022-09-09 NOTE — ED Provider Notes (Signed)
EMERGENCY DEPARTMENT AT [facility] Provider Note   CSN: [id number] Arrival date & time: 09/09/22  4098     History  Chief Complaint  Patient presents with   Anxiety   Chest Pain    Jeffrey Mendez is a 67 y.o. male.   Anxiety Associated symptoms include chest pain. Pertinent negatives include no abdominal pain, no headaches and no shortness of breath.  Chest Pain Associated symptoms: anxiety and back pain   Associated symptoms: no abdominal pain, no diaphoresis, no dizziness, no fever, no headache, no nausea, no numbness, no shortness of breath, no vomiting and no weakness         Jeffrey Mendez is a 67 y.o. male with hx of anxiety, chronic back pain, COPD, seizures who presents to the Emergency Department complaining of chest pain and anxiety.  States that he has been taking morphine tablets 4 times a day and oxycodone for break through pain for greater than 20 years.  States that his pain management provider recently decreased his medication dosing and he has been experiencing increased anxiety with chills and shakes x 2 weeks.  He reports pain of his mid to bilateral chest intermittently x 2 weeks.  Also reports having some dark colored urine and admits to decreased appetite.  He denies shortness of breath, cough, abdominal pain, nausea vomiting or diarrhea.     Home Medications Prior to Admission medications   Medication Sig Start Date End Date Taking? Authorizing Provider  busPIRone (BUSPAR) 5 MG tablet Take 1 tablet (5 mg total) by mouth 2 (two) times daily. 08/17/22   Gabriel Earing, FNP  aspirin EC 81 MG tablet Take 1 tablet (81 mg total) by mouth daily. Swallow whole. 07/26/20   O'NealRonnald Ramp, MD  atorvastatin (LIPITOR) 20 MG tablet Take 1 tablet (20 mg total) by mouth daily. 05/25/22 05/20/23  O'NealRonnald Ramp, MD  Budeson-Glycopyrrol-Formoterol (BREZTRI AEROSPHERE) 160-9-4.8 MCG/ACT AERO Inhale 2 puffs into the lungs 2 (two) times daily.     [provider]  diphenhydramine-acetaminophen (TYLENOL PM) 25-500 MG TABS tablet Take 1 tablet by mouth at bedtime as needed. Bridge tree inhaler    [provider]  Glucosamine Sulfate 1500 MG PACK Take 1,500 mg by mouth daily.    [provider]  ipratropium-albuterol (DUONEB) 0.5-2.5 (3) MG/3ML SOLN Take 3 mLs by nebulization 4 (four) times daily as needed (shortness of breath). 12/16/21   [provider]  IRON, FERROUS SULFATE, PO Take 1 tablet by mouth every other day.    [provider]  MAGNESIUM PO Take by mouth.    [provider]  morphine (MS CONTIN) 30 MG 12 hr tablet Take 30 mg by mouth every 6 (six) hours as needed. 07/08/20   [provider]  Multiple Vitamin (MULTIVITAMIN) tablet Take 1 tablet by mouth daily.      [provider]  Oxycodone HCl 10 MG TABS Take 10 mg by mouth 4 (four) times daily as needed. 03/20/22   [provider]  pantoprazole (PROTONIX) 40 MG tablet Take 1 tablet (40 mg total) by mouth 2 (two) times daily before a meal. 07/03/22   Gabriel Earing, FNP  pyridOXINE (VITAMIN B6) 100 MG tablet Take 100 mg by mouth daily.    [provider]  tamsulosin (FLOMAX) 0.4 MG CAPS capsule TAKE ONE CAPSULE DAILY 07/06/22   Delynn Flavin M, DO      Allergies    Sulfasalazine, Sulfamethoxazole, and Opana [  oxymorphone hcl]    Review of Systems   Review of Systems  Constitutional:  Positive for appetite change. Negative for diaphoresis and fever.  Respiratory:  Negative for shortness of breath.   Cardiovascular:  Positive for chest pain.  Gastrointestinal:  Negative for abdominal pain, diarrhea, nausea and vomiting.  Genitourinary:  Negative for difficulty urinating, flank pain and hematuria.       Dark colored urine  Musculoskeletal:  Positive for back pain. Negative for neck pain and neck stiffness.  Neurological:  Negative for dizziness, syncope, weakness, numbness and  headaches.  Psychiatric/Behavioral:  Negative for confusion.     Physical Exam Updated Vital Signs BP (!) 135/90 (BP Location: Left Arm)   Pulse (!) 52   Temp 99 F (37.2 C) (Oral)   Resp 16   SpO2 95%  Physical Exam Vitals and nursing note reviewed.  Constitutional:      Comments: Patient is anxious appearing  HENT:     Head: Atraumatic.     Mouth/Throat:     Mouth: Mucous membranes are moist.     Pharynx: Oropharynx is clear.  Eyes:     Extraocular Movements: Extraocular movements intact.     Pupils: Pupils are equal, round, and reactive to light.  Cardiovascular:     Rate and Rhythm: Normal rate and regular rhythm.     Pulses: Normal pulses.  Pulmonary:     Effort: Pulmonary effort is normal. No respiratory distress.  Abdominal:     Palpations: Abdomen is soft.     Tenderness: There is no abdominal tenderness.  Musculoskeletal:        General: Normal range of motion.     Cervical back: Normal range of motion. No tenderness.     Right lower leg: No edema.     Left lower leg: No edema.  Skin:    General: Skin is warm.     Capillary Refill: Capillary refill takes less than 2 seconds.  Neurological:     General: No focal deficit present.     Mental Status: He is alert.     Sensory: No sensory deficit.     Motor: No weakness.  Psychiatric:        Speech: Speech normal.     Comments: Pt is anxious appearing, tremulous.     ED Results / Procedures / Treatments   Labs (all labs ordered are listed, but only abnormal results are displayed) Labs Reviewed  URINALYSIS, ROUTINE W REFLEX MICROSCOPIC - Abnormal; Notable for the following components:      Result Value   Hgb urine dipstick SMALL (*)    Ketones, ur 5 (*)    All other components within normal limits  BASIC METABOLIC PANEL  CBC  TROPONIN I (HIGH SENSITIVITY)  TROPONIN I (HIGH SENSITIVITY)    EKG EKG Interpretation Date/Time:  Wednesday September 09 2022 09:21:09 EDT Ventricular Rate:  51 PR  Interval:  163 QRS Duration:  110 QT Interval:  459 QTC Calculation: 423 R Axis:   -47  Text Interpretation: Sinus rhythm Left axis deviation Since last tracing rate faster Confirmed by Linwood Dibbles [phone]) on 09/09/2022 9:24:37 AM  Radiology No results found.  Procedures Procedures    Medications Ordered in ED Medications - No data to display  ED Course/ Medical Decision Making/ A&P                             Medical Decision Making Pt here  with chest pain, increased anxiety, decreased appetite.  Sx's x 2 weeks chest pain has been intermittent.  Sx's began after a change to his pain medication dosing.   Sx's likely related to medication withdrawal.  Will check labs, IVF's, anxiolytic    Amount and/or Complexity of Data Reviewed Labs: ordered.    Details: Labs unremarkable Radiology: ordered.    Details: Chest XR w/o acute CP process ECG/medicine tests: ordered.    Details: Sinus rhythm,  Discussion of management or test interpretation with external provider(s): Pt with suspected opiate withdrawal sx's.  Cardiac work up reassuring, low clinical suspicion for PE.  Received IVF's, benadryl and ativan.   On recheck, pt reporting sx improvement and states he would like to go home. I have recommended close f/u with his pain management provider and strict return precautions given.       Risk Prescription drug management.           Final Clinical Impression(s) / ED Diagnoses Final diagnoses:  Atypical chest pain  Opiate withdrawal [facility])    Rx / DC Orders ED Discharge Orders     None         Rosey Bath 09/12/22 1127    Linwood Dibbles, MD 09/16/22 [phone]

## 2022-09-10 ENCOUNTER — Emergency Department (HOSPITAL_COMMUNITY): Payer: Medicare HMO

## 2022-09-10 ENCOUNTER — Other Ambulatory Visit: Payer: Self-pay

## 2022-09-10 ENCOUNTER — Telehealth: Payer: Self-pay

## 2022-09-10 ENCOUNTER — Inpatient Hospital Stay (HOSPITAL_COMMUNITY)
Admission: EM | Admit: 2022-09-10 | Discharge: 2022-09-12 | DRG: 091 | Disposition: A | Discharge status: Home / Self Care | Payer: Medicare HMO | Attending: Family Medicine | Admitting: Family Medicine

## 2022-09-10 ENCOUNTER — Encounter (HOSPITAL_COMMUNITY): Payer: Self-pay | Admitting: Emergency Medicine

## 2022-09-10 DIAGNOSIS — T424X5A Adverse effect of benzodiazepines, initial encounter: Secondary | ICD-10-CM | POA: Diagnosis present

## 2022-09-10 DIAGNOSIS — F1123 Opioid dependence with withdrawal: Secondary | ICD-10-CM | POA: Diagnosis present

## 2022-09-10 DIAGNOSIS — K219 Gastro-esophageal reflux disease without esophagitis: Secondary | ICD-10-CM | POA: Diagnosis present

## 2022-09-10 DIAGNOSIS — Y92239 Unspecified place in hospital as the place of occurrence of the external cause: Secondary | ICD-10-CM | POA: Diagnosis present

## 2022-09-10 DIAGNOSIS — R079 Chest pain, unspecified: Secondary | ICD-10-CM | POA: Diagnosis not present

## 2022-09-10 DIAGNOSIS — M419 Scoliosis, unspecified: Secondary | ICD-10-CM | POA: Diagnosis present

## 2022-09-10 DIAGNOSIS — R112 Nausea with vomiting, unspecified: Secondary | ICD-10-CM | POA: Diagnosis not present

## 2022-09-10 DIAGNOSIS — N400 Enlarged prostate without lower urinary tract symptoms: Secondary | ICD-10-CM | POA: Diagnosis present

## 2022-09-10 DIAGNOSIS — G9340 Encephalopathy, unspecified: Secondary | ICD-10-CM | POA: Diagnosis not present

## 2022-09-10 DIAGNOSIS — Z882 Allergy status to sulfonamides status: Secondary | ICD-10-CM | POA: Diagnosis not present

## 2022-09-10 DIAGNOSIS — F32A Depression, unspecified: Secondary | ICD-10-CM | POA: Diagnosis present

## 2022-09-10 DIAGNOSIS — R197 Diarrhea, unspecified: Secondary | ICD-10-CM | POA: Diagnosis not present

## 2022-09-10 DIAGNOSIS — Z87891 Personal history of nicotine dependence: Secondary | ICD-10-CM | POA: Diagnosis not present

## 2022-09-10 DIAGNOSIS — R0789 Other chest pain: Secondary | ICD-10-CM | POA: Diagnosis present

## 2022-09-10 DIAGNOSIS — Z83719 Family history of colon polyps, unspecified: Secondary | ICD-10-CM | POA: Diagnosis not present

## 2022-09-10 DIAGNOSIS — E785 Hyperlipidemia, unspecified: Secondary | ICD-10-CM | POA: Diagnosis present

## 2022-09-10 DIAGNOSIS — G8929 Other chronic pain: Secondary | ICD-10-CM | POA: Diagnosis present

## 2022-09-10 DIAGNOSIS — R54 Age-related physical debility: Secondary | ICD-10-CM | POA: Diagnosis present

## 2022-09-10 DIAGNOSIS — I671 Cerebral aneurysm, nonruptured: Secondary | ICD-10-CM | POA: Diagnosis present

## 2022-09-10 DIAGNOSIS — R443 Hallucinations, unspecified: Secondary | ICD-10-CM | POA: Diagnosis present

## 2022-09-10 DIAGNOSIS — Z7982 Long term (current) use of aspirin: Secondary | ICD-10-CM

## 2022-09-10 DIAGNOSIS — I729 Aneurysm of unspecified site: Secondary | ICD-10-CM | POA: Diagnosis present

## 2022-09-10 DIAGNOSIS — Z9079 Acquired absence of other genital organ(s): Secondary | ICD-10-CM

## 2022-09-10 DIAGNOSIS — G928 Other toxic encephalopathy: Secondary | ICD-10-CM | POA: Diagnosis present

## 2022-09-10 DIAGNOSIS — Z885 Allergy status to narcotic agent status: Secondary | ICD-10-CM | POA: Diagnosis not present

## 2022-09-10 DIAGNOSIS — F341 Dysthymic disorder: Secondary | ICD-10-CM | POA: Diagnosis present

## 2022-09-10 DIAGNOSIS — Z8249 Family history of ischemic heart disease and other diseases of the circulatory system: Secondary | ICD-10-CM | POA: Diagnosis not present

## 2022-09-10 DIAGNOSIS — Z8261 Family history of arthritis: Secondary | ICD-10-CM

## 2022-09-10 DIAGNOSIS — F419 Anxiety disorder, unspecified: Secondary | ICD-10-CM | POA: Diagnosis present

## 2022-09-10 DIAGNOSIS — Z818 Family history of other mental and behavioral disorders: Secondary | ICD-10-CM

## 2022-09-10 DIAGNOSIS — J449 Chronic obstructive pulmonary disease, unspecified: Secondary | ICD-10-CM | POA: Diagnosis present

## 2022-09-10 DIAGNOSIS — Z8349 Family history of other endocrine, nutritional and metabolic diseases: Secondary | ICD-10-CM | POA: Diagnosis not present

## 2022-09-10 DIAGNOSIS — Z79899 Other long term (current) drug therapy: Secondary | ICD-10-CM

## 2022-09-10 DIAGNOSIS — I5189 Other ill-defined heart diseases: Secondary | ICD-10-CM

## 2022-09-10 DIAGNOSIS — Z800 Family history of malignant neoplasm of digestive organs: Secondary | ICD-10-CM

## 2022-09-10 DIAGNOSIS — I3139 Other pericardial effusion (noninflammatory): Secondary | ICD-10-CM | POA: Diagnosis present

## 2022-09-10 DIAGNOSIS — Z86010 Personal history of colonic polyps: Secondary | ICD-10-CM

## 2022-09-10 LAB — COMPREHENSIVE METABOLIC PANEL
ALT: 34 U/L (ref 0–44)
AST: 54 U/L — ABNORMAL HIGH (ref 15–41)
Albumin: 4 g/dL (ref 3.5–5.0)
Alkaline Phosphatase: 80 U/L (ref 38–126)
Anion gap: 20 — ABNORMAL HIGH (ref 5–15)
BUN: 14 mg/dL (ref 8–23)
CO2: 21 mmol/L — ABNORMAL LOW (ref 22–32)
Calcium: 9.8 mg/dL (ref 8.9–10.3)
Chloride: 99 mmol/L (ref 98–111)
Creatinine, Ser: 0.98 mg/dL (ref 0.61–1.24)
GFR, Estimated: >60 mL/min (ref >60)
Glucose, Bld: 68 mg/dL — ABNORMAL LOW (ref 70–99)
Potassium: 3.5 mmol/L (ref 3.5–5.1)
Sodium: 140 mmol/L (ref 135–145)
Total Bilirubin: 0.9 mg/dL (ref 0.3–1.2)
Total Protein: 6.8 g/dL (ref 6.5–8.1)

## 2022-09-10 LAB — LIPASE, BLOOD: Lipase: 81 U/L — ABNORMAL HIGH (ref 11–51)

## 2022-09-10 LAB — URINALYSIS, ROUTINE W REFLEX MICROSCOPIC
Bilirubin Urine: NEGATIVE
Glucose, UA: NEGATIVE mg/dL
Ketones, ur: 80 mg/dL — AB
Leukocytes,Ua: NEGATIVE
Nitrite: NEGATIVE
Protein, ur: NEGATIVE mg/dL
Specific Gravity, Urine: 1.044 — ABNORMAL HIGH (ref 1.005–1.030)
pH: 5 (ref 5.0–8.0)

## 2022-09-10 LAB — CBC
HCT: 45.4 % (ref 39.0–52.0)
Hemoglobin: 15.6 g/dL (ref 13.0–17.0)
MCH: 31 pg (ref 26.0–34.0)
MCHC: 34.4 g/dL (ref 30.0–36.0)
MCV: 90.3 fL (ref 80.0–100.0)
Platelets: 213 10*3/uL (ref 150–400)
RBC: 5.03 MIL/uL (ref 4.22–5.81)
RDW: 13.2 % (ref 11.5–15.5)
WBC: 7.4 10*3/uL (ref 4.0–10.5)
nRBC: 0 % (ref 0.0–0.2)

## 2022-09-10 LAB — TROPONIN I (HIGH SENSITIVITY)
Troponin I (High Sensitivity): 27 ng/L — ABNORMAL HIGH (ref <18)
Troponin I (High Sensitivity): 21 ng/L — ABNORMAL HIGH (ref <18)

## 2022-09-10 LAB — MAGNESIUM: Magnesium: 1.7 mg/dL (ref 1.7–2.4)

## 2022-09-10 MED ORDER — BUSPIRONE HCL 5 MG PO TABS
5.0000 mg | ORAL_TABLET | Freq: Two times a day (BID) | ORAL | Status: DC
  Administered 2022-09-10 – 2022-09-12 (×4): 5 mg via ORAL
  Filled 2022-09-10 (×4): qty 1

## 2022-09-10 MED ORDER — OXYCODONE HCL 5 MG PO TABS
5.0000 mg | ORAL_TABLET | Freq: Once | ORAL | Status: AC
  Administered 2022-09-10: 5 mg via ORAL
  Filled 2022-09-10: qty 1

## 2022-09-10 MED ORDER — IOHEXOL 350 MG/ML SOLN
75.0000 mL | Freq: Once | INTRAVENOUS | Status: AC | PRN
  Administered 2022-09-10: 75 mL via INTRAVENOUS

## 2022-09-10 MED ORDER — MAGNESIUM SULFATE IN D5W 1-5 GM/100ML-% IV SOLN
1.0000 g | Freq: Once | INTRAVENOUS | Status: AC
  Administered 2022-09-11: 1 g via INTRAVENOUS
  Filled 2022-09-10: qty 100

## 2022-09-10 MED ORDER — SERTRALINE HCL 50 MG PO TABS
25.0000 mg | ORAL_TABLET | Freq: Every day | ORAL | Status: DC
  Administered 2022-09-11 – 2022-09-12 (×2): 25 mg via ORAL
  Filled 2022-09-10 (×2): qty 1

## 2022-09-10 MED ORDER — ACETAMINOPHEN 650 MG RE SUPP: 650.0000 mg | Freq: Four times a day (QID) | RECTAL | Status: DC | PRN

## 2022-09-10 MED ORDER — ONDANSETRON HCL 4 MG/2ML IJ SOLN
4.0000 mg | Freq: Once | INTRAMUSCULAR | Status: AC
  Administered 2022-09-10: 4 mg via INTRAVENOUS
  Filled 2022-09-10: qty 2

## 2022-09-10 MED ORDER — ONDANSETRON HCL 4 MG PO TABS: 4.0000 mg | ORAL_TABLET | Freq: Four times a day (QID) | ORAL | Status: DC | PRN

## 2022-09-10 MED ORDER — METHOCARBAMOL 500 MG PO TABS: 500.0000 mg | ORAL_TABLET | Freq: Three times a day (TID) | ORAL | Status: DC | PRN

## 2022-09-10 MED ORDER — IOHEXOL 350 MG/ML SOLN
100.0000 mL | Freq: Once | INTRAVENOUS | Status: AC | PRN
  Administered 2022-09-10: 100 mL via INTRAVENOUS

## 2022-09-10 MED ORDER — PANTOPRAZOLE SODIUM 40 MG PO TBEC
40.0000 mg | DELAYED_RELEASE_TABLET | Freq: Two times a day (BID) | ORAL | Status: DC
  Administered 2022-09-11 – 2022-09-12 (×3): 40 mg via ORAL
  Filled 2022-09-10 (×3): qty 1

## 2022-09-10 MED ORDER — LORAZEPAM 2 MG/ML IJ SOLN
0.5000 mg | Freq: Once | INTRAMUSCULAR | Status: AC
  Administered 2022-09-10: 0.5 mg via INTRAVENOUS
  Filled 2022-09-10: qty 1

## 2022-09-10 MED ORDER — UMECLIDINIUM BROMIDE 62.5 MCG/ACT IN AEPB
1.0000 | INHALATION_SPRAY | Freq: Every day | RESPIRATORY_TRACT | Status: DC
  Administered 2022-09-11 – 2022-09-12 (×2): 1 via RESPIRATORY_TRACT
  Filled 2022-09-10 (×2): qty 7

## 2022-09-10 MED ORDER — BUDESON-GLYCOPYRROL-FORMOTEROL 160-9-4.8 MCG/ACT IN AERO: 2.0000 | INHALATION_SPRAY | Freq: Two times a day (BID) | RESPIRATORY_TRACT | Status: DC

## 2022-09-10 MED ORDER — IPRATROPIUM-ALBUTEROL 0.5-2.5 (3) MG/3ML IN SOLN: 3.0000 mL | Freq: Four times a day (QID) | RESPIRATORY_TRACT | Status: DC | PRN

## 2022-09-10 MED ORDER — SODIUM CHLORIDE 0.9 % IV BOLUS
500.0000 mL | Freq: Once | INTRAVENOUS | Status: AC
  Administered 2022-09-10: 500 mL via INTRAVENOUS

## 2022-09-10 MED ORDER — LIDOCAINE 5 % EX PTCH
1.0000 | MEDICATED_PATCH | CUTANEOUS | Status: DC
  Administered 2022-09-10 – 2022-09-12 (×3): 1 via TRANSDERMAL
  Filled 2022-09-10 (×3): qty 1

## 2022-09-10 MED ORDER — HYDROXYZINE HCL 25 MG PO TABS
25.0000 mg | ORAL_TABLET | Freq: Three times a day (TID) | ORAL | Status: DC | PRN
  Administered 2022-09-11 – 2022-09-12 (×4): 25 mg via ORAL
  Filled 2022-09-10 (×5): qty 1

## 2022-09-10 MED ORDER — SODIUM CHLORIDE 0.9% FLUSH
3.0000 mL | Freq: Two times a day (BID) | INTRAVENOUS | Status: DC
  Administered 2022-09-11 – 2022-09-12 (×4): 3 mL via INTRAVENOUS

## 2022-09-10 MED ORDER — OXYCODONE HCL 5 MG PO TABS
5.0000 mg | ORAL_TABLET | ORAL | Status: DC | PRN
  Administered 2022-09-11 – 2022-09-12 (×4): 5 mg via ORAL
  Filled 2022-09-10 (×4): qty 1

## 2022-09-10 MED ORDER — ACETAMINOPHEN 325 MG PO TABS: 650.0000 mg | ORAL_TABLET | Freq: Four times a day (QID) | ORAL | Status: DC | PRN

## 2022-09-10 MED ORDER — HYDROMORPHONE HCL 1 MG/ML IJ SOLN
0.5000 mg | INTRAMUSCULAR | Status: DC | PRN
  Administered 2022-09-11 – 2022-09-12 (×4): 0.5 mg via INTRAVENOUS
  Filled 2022-09-10 (×5): qty 1

## 2022-09-10 MED ORDER — FLUTICASONE FUROATE-VILANTEROL 200-25 MCG/ACT IN AEPB
1.0000 | INHALATION_SPRAY | Freq: Every day | RESPIRATORY_TRACT | Status: DC
  Administered 2022-09-12: 1 via RESPIRATORY_TRACT
  Filled 2022-09-10 (×3): qty 28

## 2022-09-10 MED ORDER — OXYCODONE HCL 5 MG PO TABS: 5.0000 mg | ORAL_TABLET | ORAL | Status: DC | PRN

## 2022-09-10 MED ORDER — ATORVASTATIN CALCIUM 10 MG PO TABS
20.0000 mg | ORAL_TABLET | Freq: Every day | ORAL | Status: DC
  Administered 2022-09-11 – 2022-09-12 (×2): 20 mg via ORAL
  Filled 2022-09-10 (×2): qty 2

## 2022-09-10 MED ORDER — IOHEXOL 350 MG/ML SOLN: 75.0000 mL | Freq: Once | INTRAVENOUS | Status: DC | PRN

## 2022-09-10 MED ORDER — SODIUM CHLORIDE 0.9 % IV BOLUS: 1000.0000 mL | Freq: Once | INTRAVENOUS | Status: DC

## 2022-09-10 MED ORDER — ONDANSETRON HCL 4 MG/2ML IJ SOLN
4.0000 mg | Freq: Four times a day (QID) | INTRAMUSCULAR | Status: DC | PRN
  Filled 2022-09-10: qty 2

## 2022-09-10 MED ORDER — TAMSULOSIN HCL 0.4 MG PO CAPS
0.4000 mg | ORAL_CAPSULE | Freq: Every day | ORAL | Status: DC
  Administered 2022-09-11 – 2022-09-12 (×2): 0.4 mg via ORAL
  Filled 2022-09-10 (×2): qty 1

## 2022-09-10 MED ORDER — POTASSIUM CHLORIDE CRYS ER 20 MEQ PO TBCR
20.0000 meq | EXTENDED_RELEASE_TABLET | Freq: Once | ORAL | Status: AC
  Administered 2022-09-10: 20 meq via ORAL
  Filled 2022-09-10: qty 1

## 2022-09-10 NOTE — ED Triage Notes (Signed)
PT presents POV for chest pain and n/v/d on and off x 2 weeks worse the past 2 days. Pt was on morphine and oxy for over 25 years for back pain and 1 month ago stop taking medications. Pt contact pain doctor who advised he come to ER for chest xray and further evaluation

## 2022-09-10 NOTE — ED Notes (Signed)
Pt is having visual hallucinations and is now restless in the bed. Pt wife at bedside to keep him in bed. PA made aware
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
## 2022-09-10 NOTE — ED Provider Notes (Signed)
  Accepted handoff at shift change from [facility]. Please see prior provider note for more detail.   Briefly: Patient is 67 y.o. "complaints of nausea, vomiting, abdominal pain, chest pain. Patient states he has been dealing with symptoms intermittently over the past 2 weeks. States that he has been on oxycodone as well as morphine for his chronic low back pain for several years. States recently over the past 2 weeks, has noticed more feelings of anxiousness associated with taking his pain medication so he has stopped it altogether. States that since he is stopped his pain medication, developed nausea as well as vomiting. "  DDX: concern for pancreatitis, ACS, PE, pneumothorax, CBD pathology, cholecystitis, SBO/LBO, volvulus, diverticulitis, appendicitis polysubstance, medication withdrawal/intoxication, substance withdrawal/intoxication   Plan:  - Admitting patient for AMS, hallucinations, and brain aneurysm. - PMHx seizure, COPD, chronic back pain, hyperlipidemia, IBS  - patient presents to ED concerned for nausea, vomiting, abd pain and chest pain intermittently over the past 2 weeks. Severe increase in recent life stressors and recently stopped taking chronic narcotics. - Physical exam unremarkable other than generalized abdominal tenderness to palpation. Vitals stable. 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 CT abd/pelv showing possible gastritis. CT angio chest showing small pericardial effusion. Chest xray showing small right effusion. - initial troponin 27 which decreased to 21. EKG reassuring. Lipase elevated at 81. UA not concerning for infection. Mag within normal limits. CMP without concern for electrolyte abnormality. UDS pending. - Consulted with Meagan from Neurosurgery who recommended patient be brought in since IR coils aneurysms that are larger than 5mm. - gave patient ativan, fluids, and fluids in ED. Patient denying hallucinations but  family stating that he is talking about things that aren't there - Dr. Antionette Char admitting provider.    Margarita Rana 09/10/22 2029    Lonell Grandchild, MD 09/10/22 [phone]

## 2022-09-10 NOTE — ED Triage Notes (Signed)
Pt states has had decreased appetite and lost approx 40 lbs in 2 year.

## 2022-09-10 NOTE — ED Notes (Signed)
Pt back from CT

## 2022-09-10 NOTE — ED Notes (Signed)
Shift report received, assumed care of patient at this time 

## 2022-09-10 NOTE — Transitions of Care (Post Inpatient/ED Visit) (Signed)
09/10/2022  Name: Jeffrey Mendez MRN: [id number] DOB: Jun 12, 1955  Today's TOC FU Call Status: Today's TOC FU Call Status:: Successful TOC FU Call Competed TOC FU Call Complete Date: 09/10/22  Transition Care Management Follow-up Telephone Call Date of Discharge: 09/09/22 [facility] Type of Discharge: Emergency Department Reason for ED Visit: Other: (other chest pain) How have you been since you were released from the hospital?: Same Any questions or concerns?: No  Items Reviewed: Did you receive and understand the discharge instructions provided?: Yes Medications obtained,verified, and reconciled?: Yes (Medications Reviewed) Any new allergies since your discharge?: No Dietary orders reviewed?: Yes Do you have support at home?: Yes People in Home: sibling(s), parent(s)  Medications Reviewed Today: Medications Reviewed Today     Reviewed by Karena Addison, LPN (Licensed Practical Nurse) on 09/10/22 at [id number]  Med List Status: <None>   Medication Order Taking? Sig Documenting Provider Last Dose Status Informant  aspirin EC 81 MG tablet [id number] No Take 1 tablet (81 mg total) by mouth daily. Swallow whole. Sande Rives, MD Taking Active Self, Pharmacy Records  atorvastatin (LIPITOR) 20 MG tablet [id number] No Take 1 tablet (20 mg total) by mouth daily. Sande Rives, MD Taking Active   Budeson-Glycopyrrol-Formoterol (BREZTRI AEROSPHERE) 160-9-4.8 MCG/ACT Sandrea Matte [id number] No Inhale 2 puffs into the lungs 2 (two) times daily. [provider] Taking Active Self, Pharmacy Records  busPIRone (BUSPAR) 5 MG tablet [id number]  Take 1 tablet (5 mg total) by mouth 2 (two) times daily. Gabriel Earing, FNP  Active   diphenhydramine-acetaminophen (TYLENOL PM) 25-500 MG TABS tablet [id number] No Take 1 tablet by mouth at bedtime as needed. Bridge tree inhaler [provider] Taking Active   Glucosamine Sulfate 1500 MG PACK [id number] No  Take 1,500 mg by mouth daily. [provider] Taking Active   ipratropium-albuterol (DUONEB) 0.5-2.5 (3) MG/3ML SOLN [id number] No Take 3 mLs by nebulization 4 (four) times daily as needed (shortness of breath). [provider] Taking Active Self, Pharmacy Records  IRON, FERROUS SULFATE, PO [id number] No Take 1 tablet by mouth every other day. [provider] Taking Active   MAGNESIUM PO [id number] No Take by mouth. [provider] Taking Active Self, Pharmacy Records  methocarbamol (ROBAXIN) 500 MG tablet [id number]  Take 1 tablet (500 mg total) by mouth 3 (three) times daily. Triplett, Tammy, PA-C  Active   morphine (MS CONTIN) 30 MG 12 hr tablet [id number] No Take 30 mg by mouth every 6 (six) hours as needed. [provider] Taking Active Self, Pharmacy Records  Multiple Vitamin (MULTIVITAMIN) tablet [id number] No Take 1 tablet by mouth daily.   [provider] Taking Active Self, Pharmacy Records  Oxycodone HCl 10 MG TABS [id number] No Take 10 mg by mouth 4 (four) times daily as needed. [provider] Taking Active Self, Pharmacy Records  pantoprazole (PROTONIX) 40 MG tablet [id number] No Take 1 tablet (40 mg total) by mouth 2 (two) times daily before a meal. Gabriel Earing, FNP Taking Active   pyridOXINE (VITAMIN B6) 100 MG tablet [id number] No Take 100 mg by mouth daily. [provider] Taking Active   tamsulosin (FLOMAX) 0.4 MG CAPS capsule [id number] No TAKE ONE CAPSULE DAILY Raliegh Ip, DO Taking Active             Home Care and Equipment/Supplies: Were Home Health Services Ordered?: NA Any new equipment or medical supplies ordered?: NA  Functional Questionnaire: Do you need assistance with bathing/showering  or dressing?: No Do you need assistance with meal preparation?: No Do you need assistance with eating?: No Do you have difficulty maintaining continence: No Do you need assistance with getting out of  bed/getting out of a chair/moving?: No Do you have difficulty managing or taking your medications?: No  Follow up appointments reviewed: PCP Follow-up appointment confirmed?: No (declined appt) Specialist Hospital Follow-up appointment confirmed?: NA Do you need transportation to your follow-up appointment?: No Do you understand care options if your condition(s) worsen?: Yes-patient verbalized understanding    SIGNATURE Karena Addison, LPN [facility] Nurse Health Advisor Direct Dial [phone]

## 2022-09-11 ENCOUNTER — Inpatient Hospital Stay (HOSPITAL_COMMUNITY): Payer: Medicare HMO

## 2022-09-11 DIAGNOSIS — I671 Cerebral aneurysm, nonruptured: Secondary | ICD-10-CM | POA: Diagnosis not present

## 2022-09-11 DIAGNOSIS — R079 Chest pain, unspecified: Secondary | ICD-10-CM | POA: Diagnosis not present

## 2022-09-11 MED ORDER — HALOPERIDOL LACTATE 5 MG/ML IJ SOLN
5.0000 mg | Freq: Once | INTRAMUSCULAR | Status: AC
  Administered 2022-09-11: 5 mg via INTRAVENOUS
  Filled 2022-09-11: qty 1

## 2022-09-11 NOTE — ED Notes (Signed)
Pt appearing calmer but still restless in bed.  Visitor reports some improvement but states pt is still occasionally trying to pull at IV tubing and fidget with monitoring cables.

## 2022-09-11 NOTE — ED Notes (Addendum)
Pt continues to be pull at IV tubing/monitoring equipment and is restless in bed.  Endorsing anxiety and trouble sleeping.  This RN to try ordered PRN Hydroxyzine for pt.

## 2022-09-11 NOTE — Progress Notes (Signed)
  Echocardiogram 2D Echocardiogram has been performed.  Milda Smart 09/11/2022, 2:01 PM

## 2022-09-11 NOTE — ED Notes (Addendum)
Pt appears more calm overall but states he still feels about the same after the dose of Haldol given prior.  Pt back in stretcher at this time; has been shifting from stretcher to recliner in room.

## 2022-09-11 NOTE — ED Notes (Signed)
Pt ambulatory to restroom with steady gait.

## 2022-09-11 NOTE — ED Notes (Signed)
Pt restless and appearing anxious.  Visitor at bedside concerned for withdrawal symptoms, stating that pt stopped taking his pain medications at home.  This RN updated pt and visitor on ordered Oxycodone that is scheduled for now and current plan of care.

## 2022-09-11 NOTE — ED Notes (Signed)
Pt endorses ongoing anxiety and feeling "out of sorts."  Pt requesting something to help with this.  This RN to reach out to covering provider about same.

## 2022-09-11 NOTE — Progress Notes (Signed)
PROGRESS NOTE    Jeffrey Mendez  ZOX:[id number] DOB: August 01, 1955 DOA: 09/10/2022 PCP: Jeffrey Earing, FNP  Chief Complaint  Patient presents with   Chest Pain   Emesis   Diarrhea    Brief Narrative:   Jeffrey Mendez is Jeffrey Mendez 67 y.o. male with medical history significant for COPD, depression, anxiety, and chronic pain with opiate dependence who presents to the ED with chest pain, nausea, vomiting, diarrhea, and anxiety.   Patient reports that he had been feeling more anxious than usual, thought that it may be due to the morphine and oxycodone that he has been taking regularly for years, and so he stopped those medications.  He has since gone on to develop nausea with vomiting and diarrhea.  He has also had increasing anxiety.  He describes Jeffrey Mendez "squeezing" discomfort in his chest that has been intermittent over the past 2 weeks and without any alleviating or exacerbating factors that he can identify.   ED Course: Upon arrival to the ED, patient is found to be afebrile and saturating well on room air.  Labs are most notable for normal renal function, normal WBC, troponin 27, and ketonuria.  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  No PE is noted on CTA chest but small pericardial effusion and flattening of the anterior atrial septum was seen.   Neurosurgery [facility] [place], [place]) was consulted by the ED PA and recommended medical admission for treatment of the aneurysm.  Patient was given Lidoderm, Ativan, Zofran, and IV fluids in the ED.  Assessment & Plan:   Principal Problem:   Anterior communicating artery aneurysm Active Problems:   ANXIETY DEPRESSION   Chronic obstructive pulmonary disease (HCC)   Chronic back pain   Nausea vomiting and diarrhea   Acute encephalopathy   Pericardial effusion  1. Anterior communicating artery aneurysm  - 10 x 10 x 8 mm ACA aneurysm noted on CTA in ED  - Neurosurgery recommended admission for treatment   - will reach out  to neurosurgery   2. Flattened atrial septum; small pericardial effusion  - Check echocardiogram -> EF 60-65%, no RWMA, mildly reduced RVSF   3. N/V/D  Opiate Withdrawal   - Exam is benign, likely d/t opiate withdrawal  - not clear why he stopped cold [place], on further discussion, sounds like he may have been titrated down by outpatient provider? - has prescriptions filled for morphine sulfate ER 30 mg tablet 120 to last 30 days and oxycodone 10 mg tablets 90 to last 30 days that were recently filled 08/17/2022 - Restart oxycodone at lower dose, monitor and replete electrolytes, continue IVF hydration     4. COPD  - Not in exacerbation on admission  - Continue ICS-LAMA-LABA and as-needed DuoNebs      5. Acute encephalopathy  - Pt developed hallucinations in ED per report of his family  - Now resolved, was likely d/t Ativan     6. Anxiety, depression  - Continue Buspar, Zoloft, and as-needed hydroxyzine     DVT prophylaxis: SCD Code Status: full Family Communication: brother Disposition:   Status is: Inpatient Remains inpatient appropriate because: continued need for inpatient care   Consultants:  neurosurgery  Procedures:  Echo IMPRESSIONS     1. Left ventricular ejection fraction, by estimation, is 60 to 65%. The  left ventricle has normal function. The left ventricle has no regional  wall motion abnormalities. Left ventricular diastolic parameters were  normal.   2.  Right ventricular systolic function is mildly reduced. The right  ventricular size is normal.   3. The mitral valve is normal in structure. Trivial mitral valve  regurgitation.   4. The aortic valve is tricuspid. Aortic valve regurgitation is not  visualized.   5. The inferior vena cava is normal in size with greater than 50%  respiratory variability, suggesting right atrial pressure of 3 mmHg.   Comparison(s): The left ventricular function is unchanged.   Antimicrobials:  Anti-infectives (From  admission, onward)    None       Subjective: No complaints Brother at bedside  Objective: Vitals:   09/11/22 0815 09/11/22 0900 09/11/22 0918 09/11/22 1252  BP: 121/81 112/74  122/86  Pulse: 63   69  Resp: 18 19  18   Temp:   98 F (36.7 C) 98.9 F (37.2 C)  TempSrc:    Oral  SpO2: 94%   97%  Weight:      Height:        Intake/Output Summary (Last 24 hours) at 09/11/2022 1706 Last data filed at 09/11/2022 0127 Gross per 24 hour  Intake 100 ml  Output --  Net 100 ml   Filed Weights   09/10/22 1205  Weight: 54.4 kg    Examination:  General exam: Appears calm and comfortable  Respiratory system: unlabored Cardiovascular system:RRR Gastrointestinal system: Abdomen is nondistended, soft and nontender.  Central nervous system: Alert and oriented. No focal neurological deficits. Extremities: no LEE    Data Reviewed: I have personally reviewed following labs and imaging studies  CBC: Recent Labs  Lab 09/09/22 0940 09/10/22 1207 09/11/22 0212  WBC 4.0 7.4 6.9  HGB 14.0 15.6 13.9  HCT 41.6 45.4 40.0  MCV 91.4 90.3 89.3  PLT 151 213 196    Basic Metabolic Panel: Recent Labs  Lab 09/09/22 0940 09/10/22 1236 09/11/22 0212  NA 138 140 137  K 4.0 3.5 3.5  CL 104 99 105  CO2 28 21* 22  GLUCOSE 98 68* 84  BUN 12 14 16   CREATININE 0.85 0.98 0.95  CALCIUM 9.0 9.8 8.8*  MG  --  1.7  --     GFR: Estimated Creatinine Clearance: 59.6 mL/min (by C-G formula based on SCr of 0.95 mg/dL).  Liver Function Tests: Recent Labs  Lab 09/10/22 1236  AST 54*  ALT 34  ALKPHOS 80  BILITOT 0.9  PROT 6.8  ALBUMIN 4.0    CBG: No results for input(s): "GLUCAP" in the last 168 hours.   No results found for this or any previous visit (from the past 240 hour(s)).       Radiology Studies: ECHOCARDIOGRAM COMPLETE  Result Date: 09/11/2022    ECHOCARDIOGRAM REPORT   Patient Name:   Jeffrey Mendez Date of Exam: 09/11/2022 Medical Rec #:  [id number]     Height:        66.0 in Accession #:    [id number]    Weight:       120.0 lb Date of Birth:  [birth year]    BSA:          1.610 m Patient Age:    66 years      BP:           122/86 mmHg Patient Gender: M             HR:           61 bpm. Exam Location:  Inpatient Procedure: 2D Echo, Cardiac Doppler and Color Doppler Indications:  Chest pain                 Pericardial effusion  History:        Patient has prior history of Echocardiogram examinations, most                 recent 08/14/2020. COPD; Signs/Symptoms:Chest Pain and N/V.                 Chronic pain, opiate dependence.  Sonographer:    Milda Smart Referring Phys: [id number] TIMOTHY S OPYD  Sonographer Comments: Image acquisition challenging due to patient body habitus and Image acquisition challenging due to respiratory motion. IMPRESSIONS  1. Left ventricular ejection fraction, by estimation, is 60 to 65%. The left ventricle has normal function. The left ventricle has no regional wall motion abnormalities. Left ventricular diastolic parameters were normal.  2. Right ventricular systolic function is mildly reduced. The right ventricular size is normal.  3. The mitral valve is normal in structure. Trivial mitral valve regurgitation.  4. The aortic valve is tricuspid. Aortic valve regurgitation is not visualized.  5. The inferior vena cava is normal in size with greater than 50% respiratory variability, suggesting right atrial pressure of 3 mmHg. Comparison(s): The left ventricular function is unchanged. FINDINGS  Left Ventricle: Left ventricular ejection fraction, by estimation, is 60 to 65%. The left ventricle has normal function. The left ventricle has no regional wall motion abnormalities. The left ventricular internal cavity size was normal in size. There is  no left ventricular hypertrophy. Left ventricular diastolic parameters were normal. Right Ventricle: The right ventricular size is normal. Right vetricular wall thickness was not assessed. Right ventricular  systolic function is mildly reduced. Left Atrium: Left atrial size was normal in size. Right Atrium: Right atrial size was normal in size. Pericardium: Trivial pericardial effusion is present. Mitral Valve: The mitral valve is normal in structure. Trivial mitral valve regurgitation. MV peak gradient, 4.2 mmHg. The mean mitral valve gradient is 2.0 mmHg. Tricuspid Valve: The tricuspid valve is normal in structure. Tricuspid valve regurgitation is mild. Aortic Valve: The aortic valve is tricuspid. Aortic valve regurgitation is not visualized. Pulmonic Valve: The pulmonic valve was normal in structure. Pulmonic valve regurgitation is not visualized. Aorta: The aortic root is normal in size and structure. Venous: The inferior vena cava is normal in size with greater than 50% respiratory variability, suggesting right atrial pressure of 3 mmHg. IAS/Shunts: No atrial level shunt detected by color flow Doppler.  LEFT VENTRICLE PLAX 2D LVIDd:         3.80 cm     Diastology LVIDs:         2.70 cm     LV e' medial:    8.70 cm/s LV PW:         0.80 cm     LV E/e' medial:  9.6 LV IVS:        0.80 cm     LV e' lateral:   11.70 cm/s LVOT diam:     2.10 cm     LV E/e' lateral: 7.1 LV SV:         95 LV SV Index:   59 LVOT Area:     3.46 cm  LV Volumes (MOD) LV vol d, MOD A2C: 82.8 ml LV vol d, MOD A4C: 76.6 ml LV vol s, MOD A2C: 25.4 ml LV vol s, MOD A4C: 25.9 ml LV SV MOD A2C:     57.4 ml LV SV MOD A4C:  76.6 ml LV SV MOD BP:      55.2 ml RIGHT VENTRICLE RV Basal diam:  3.70 cm RV Mid diam:    3.20 cm RV S prime:     16.00 cm/s TAPSE (M-mode): 2.1 cm LEFT ATRIUM             Index        RIGHT ATRIUM           Index LA diam:        1.80 cm 1.12 cm/m   RA Area:     15.60 cm LA Vol (A2C):   26.0 ml 16.15 ml/m  RA Volume:   40.70 ml  25.29 ml/m LA Vol (A4C):   16.0 ml 9.94 ml/m LA Biplane Vol: 20.6 ml 12.80 ml/m  AORTIC VALVE LVOT Vmax:   137.00 cm/s LVOT Vmean:  76.400 cm/s LVOT VTI:    0.273 m  AORTA Ao Root diam: 3.00 cm  MITRAL VALVE               TRICUSPID VALVE MV Area (PHT): 2.26 cm    TR Peak grad:   38.2 mmHg MV Area VTI:   2.57 cm    TR Mean grad:   24.0 mmHg MV Peak grad:  4.2 mmHg    TR Vmax:        309.00 cm/s MV Mean grad:  2.0 mmHg    TR Vmean:       230.0 cm/s MV Vmax:       1.03 m/s MV Vmean:      56.0 cm/s   SHUNTS MV Decel Time: 335 msec    Systemic VTI:  0.27 m MV E velocity: 83.30 cm/s  Systemic Diam: 2.10 cm MV Tyasia Packard velocity: 55.50 cm/s MV E/Shandie Bertz ratio:  1.50 Dietrich Pates MD Electronically signed by Dietrich Pates MD Signature Date/Time: 09/11/2022/4:15:27 PM    Final    CT ANGIO HEAD NECK W WO CM  Result Date: 09/10/2022 CLINICAL DATA:  AVM/AVF, high flow vascular malformation EXAM: CT ANGIOGRAPHY HEAD AND NECK WITH AND WITHOUT CONTRAST TECHNIQUE: Multidetector CT imaging of the head and neck was performed using the standard protocol during bolus administration of intravenous contrast. Multiplanar CT image reconstructions and MIPs were obtained to evaluate the vascular anatomy. Carotid stenosis measurements (when applicable) are obtained utilizing NASCET criteria, using the distal internal carotid diameter as the denominator. RADIATION DOSE REDUCTION: This exam was performed according to the departmental dose-optimization program which includes automated exposure control, adjustment of the mA and/or kV according to patient size and/or use of iterative reconstruction technique. CONTRAST:  OMNIPAQUE IOHEXOL 350 MG/ML SOLN COMPARISON:  None Available. FINDINGS: CTA NECK FINDINGS Aortic arch: Minimal atherosclerotic calcifications are present the distal aortic arch and at the origin of the left subclavian artery without significant stenosis or aneurysm. Right carotid system: Right common carotid artery is within normal limits. The bifurcation is unremarkable. Moderate tortuosity is present in the cervical right ICA without significant stenosis. Left carotid system: The left common carotid artery is within normal  limits. Mild atherosclerotic changes are present bifurcation proximal left ICA without significant stenosis. Moderate tortuosity is present in the more distal cervical left ICA without significant stenosis. Vertebral arteries: The right vertebral artery is the dominant vessel. Both vertebral arteries originate from the subclavian arteries without significant stenosis. There is some tortuosity in the V1 segments bilaterally without significant stenosis. No significant stenosis is present in either vertebral artery in the neck. Skeleton: Mild degenerative changes  of the cervical spine are most evident at C3-4 and C6-7. No focal osseous lesions are present. Other neck: Anacleto Batterman calcified 5 mm nodule is present in the right lobe of the thyroid. No significant adenopathy is present. The submandibular and parotid glands and ducts are within normal limits. No focal mucosal lesions are present. Upper chest: Moderate centrilobular emphysematous changes are present at both lung apices. No nodule or mass lesion is present. The thoracic inlet is within normal limits. Review of the MIP images confirms the above findings CTA HEAD FINDINGS Anterior circulation: The internal carotid arteries are within normal limits the skull base to the ICA termini bilaterally. The A1 and M1 segments are normal. And anterior communicating artery aneurysm measures 10 x 10 x 8 mm. Both A2 segments originate from the base of the aneurysm. The MCA bifurcations are within normal limits bilaterally. The more distal ACA branches and the MCA branches bilaterally are normal. Posterior circulation: The PICA origins are visualized and normal. The vertebrobasilar junction and basilar artery are normal. The superior cerebellar arteries are patent bilaterally. Both posterior cerebral arteries originate from the basilar tip. The PCA branch vessels are normal bilaterally. Venous sinuses: The dural sinuses are patent. The straight sinus and deep cerebral veins are  intact. Cortical veins are within normal limits. No significant vascular malformation is evident. Anatomic variants: None Review of the MIP images confirms the above findings IMPRESSION: 1. 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 Both A2 segments originate from the base of the aneurysm. 2. No other significant proximal stenosis, aneurysm, or branch vessel occlusion within the Circle of Willis. 3. Mild atherosclerotic changes at the proximal left ICA without significant stenosis. 4. Tortuosity of the cervical internal carotid arteries and V1 segments of the vertebral arteries bilaterally without significant stenosis. 5. Mild degenerative changes of the cervical spine are most evident at C3-4 and C6-7. 6. 5 mm calcified nodule in the right lobe of the thyroid. No follow-up imaging is recommended. Reference: J Am Coll Radiol. 2015 Feb;12(2): 143-50 7.  Emphysema (ICD10-J43.9). Electronically Signed   By: Marin Roberts M.D.   On: 09/10/2022 18:18   CT ABDOMEN PELVIS W CONTRAST  Result Date: 09/10/2022 CLINICAL DATA:  Weight loss, abdominal pain EXAM: CT ABDOMEN AND PELVIS WITH CONTRAST TECHNIQUE: Multidetector CT imaging of the abdomen and pelvis was performed using the standard protocol following bolus administration of intravenous contrast. RADIATION DOSE REDUCTION: This exam was performed according to the departmental dose-optimization program which includes automated exposure control, adjustment of the mA and/or kV according to patient size and/or use of iterative reconstruction technique. CONTRAST:  75mL OMNIPAQUE IOHEXOL 350 MG/ML SOLN COMPARISON:  05/28/2022 FINDINGS: Lower chest: Emphysematous changes are noted in the lower lung fields. There is no focal consolidation. There is no pleural effusion. Hepatobiliary: There is no dilation of bile ducts. There are few subcentimeter low-density lesions, possibly cysts or hemangiomas. Gallbladder is not distended. Pancreas: No focal  abnormalities are seen. Spleen: Unremarkable. Adrenals/Urinary Tract: There are nodular densities in both adrenals with no significant interval change. There is no hydronephrosis. There are no renal or ureteral stones. Urinary bladder is unremarkable. Stomach/Bowel: Stomach is not distended. There is prominence of mucosal folds in stomach which appears less prominent. There is fluid in the lumen of small bowel loops and colon. Appendix is not distinctly seen. There is no pericecal inflammation. There is no significant wall thickening in colon. Vascular/Lymphatic: Calcifications are seen in aorta and its major branches. Reproductive: Coarse  calcifications are seen in prostate. Other: There is no ascites or pneumoperitoneum. Musculoskeletal: Decrease in height of body of L1 vertebra has not changed. Decrease in height of the body of T11 vertebra has not changed. Rotoscoliosis is seen at thoracolumbar junction with convexity to the left. IMPRESSION: There is no evidence of intestinal obstruction or pneumoperitoneum. There is no hydronephrosis. There is prominence of mucosal folds in the stomach which may be due to incomplete distention all suggest gastritis. If clinically warranted, follow-up endoscopy may be considered. There is fluid in the lumen of small bowel loops and colon suggesting possible nonspecific enteritis. Subcentimeter low-density foci in liver may suggest cysts or hemangiomas. Small nodules in both adrenals appear stable. COPD. Aortic arteriosclerosis. Electronically Signed   By: Ernie Avena M.D.   On: 09/10/2022 15:46   CT Angio Chest Pulmonary Embolism (PE) W or WO Contrast  Result Date: 09/10/2022 CLINICAL DATA:  weight loss. Abdominal pain EXAM: CT ANGIOGRAPHY CHEST WITH CONTRAST TECHNIQUE: Multidetector CT imaging of the chest was performed using the standard protocol during bolus administration of intravenous contrast. Multiplanar CT image reconstructions and MIPs were obtained to  evaluate the vascular anatomy. RADIATION DOSE REDUCTION: This exam was performed according to the departmental dose-optimization program which includes automated exposure control, adjustment of the mA and/or kV according to patient size and/or use of iterative reconstruction technique. CONTRAST:  75mL OMNIPAQUE IOHEXOL 350 MG/ML SOLN COMPARISON:  None Available. FINDINGS: Cardiovascular: Satisfactory opacification of the pulmonary arteries to the segmental level. No evidence of pulmonary embolism. Normal heart size. Small pericardial effusion. There is nonspecific flattening of the anterior atrial septum Mediastinum/Nodes: No enlarged mediastinal, hilar, or axillary lymph nodes. Thyroid gland, trachea, and esophagus demonstrate no significant findings. Lungs/Pleura: Severe centrilobular emphysema. No focal consolidative opacity is visualized. No pleural effusion. No pneumothorax. Central airways are patent. Upper Abdomen: See separately dictated CT abdomen and pelvis for additional findings. Musculoskeletal: No chest wall abnormality. N no acute osseous abnormality. Redemonstrated are compression deformities of the T11 and T8 vertebral bodies. Review of the MIP images confirms the above findings. IMPRESSION: 1. No evidence of pulmonary embolism. 2. Severe centrilobular emphysema. 3. Small pericardial effusion. 4. Nonspecific flattening of the anterior atrial septum. Recommend correlation with echocardiogram. Emphysema (ICD10-J43.9). Electronically Signed   By: Lorenza Cambridge M.D.   On: 09/10/2022 15:24   CT Head Wo Contrast  Result Date: 09/10/2022 CLINICAL DATA:  Mental status change, unknown cause EXAM: CT HEAD WITHOUT CONTRAST TECHNIQUE: Contiguous axial images were obtained from the base of the skull through the vertex without intravenous contrast. RADIATION DOSE REDUCTION: This exam was performed according to the departmental dose-optimization program which includes automated exposure control, adjustment of  the mA and/or kV according to patient size and/or use of iterative reconstruction technique. COMPARISON:  MRA Head 11/02/05 FINDINGS: Brain: No hemorrhage. No hydrocephalus. No extra-axial fluid collection. There is sequela of mild chronic microvascular ischemic change. No CT evidence of an acute cortical infarct. Vascular: There is Carlis Blanchard new 1.2 x 0.8 cm saccular aneurysm likely arising from the Maecyn Panning-comm. Skull: Normal. Negative for fracture or focal lesion. Sinuses/Orbits: 2 no middle ear or mastoid effusion. Frothy secretions in the right maxillary sinus, which can be seen in the setting of acute sinusitis. Orbits are unremarkable. Other: None. IMPRESSION: 1. 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 Recommend further evaluation with Mutasim Tuckey CTA head. 2. No acute intracranial abnormality. 3. Frothy secretions in the right maxillary sinus, which can be seen  in the setting of acute sinusitis. Electronically Signed   By: Lorenza Cambridge M.D.   On: 09/10/2022 15:14   DG Chest 2 View  Result Date: 09/10/2022 CLINICAL DATA:  Chest pain EXAM: CHEST - 2 VIEW COMPARISON:  09/09/2022 FINDINGS: Hyperinflation with chronic changes. Small right effusion. No consolidation, pneumothorax or edema. Normal cardiopericardial silhouette. Curvature of the spine. Osteopenia IMPRESSION: Hyperinflation with chronic changes with small right effusion. Electronically Signed   By: Karen Kays M.D.   On: 09/10/2022 13:41        Scheduled Meds:  atorvastatin  20 mg Oral Daily   busPIRone  5 mg Oral BID   fluticasone furoate-vilanterol  1 puff Inhalation Daily   lidocaine  1 patch Transdermal Q24H   pantoprazole  40 mg Oral BID AC   sertraline  25 mg Oral Daily   sodium chloride flush  3 mL Intravenous Q12H   tamsulosin  0.4 mg Oral Daily   umeclidinium bromide  1 puff Inhalation Daily   Continuous Infusions:   LOS: 1 day    Time spent: over 30 min     Lacretia Nicks, MD [facility]   To  contact the attending provider between 7A-7P or the covering provider during after hours 7P-7A, please log into the web site [email or web address] and access using universal [place] password for that web site. If you do not have the password, please call the hospital operator.  09/11/2022, 5:06 PM
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
## 2022-09-11 NOTE — ED Notes (Signed)
Pt transferred to ED 46 and received for care at this time.  Bed in lowest position, wheels locked.  Call bell within reach.

## 2022-09-12 DIAGNOSIS — I671 Cerebral aneurysm, nonruptured: Secondary | ICD-10-CM | POA: Diagnosis not present

## 2022-09-12 MED ORDER — POTASSIUM CHLORIDE CRYS ER 20 MEQ PO TBCR
40.0000 meq | EXTENDED_RELEASE_TABLET | ORAL | Status: DC
  Administered 2022-09-12: 40 meq via ORAL
  Filled 2022-09-12 (×2): qty 2

## 2022-09-12 NOTE — Discharge Summary (Signed)
Physician Discharge Summary  Jeffrey Mendez ZOX:[id number] DOB: 06/24/1955 DOA: 09/10/2022  PCP: Jeffrey Earing, FNP  Admit date: 09/10/2022 Discharge date: 09/12/2022  Time spent: 40 minutes  Recommendations for Outpatient Follow-up:  Follow outpatient CBC/CMP  Follow neurosurgery outpatient for ACA aneurysm Follow pain management outpatient - adjust as indicated Follow cardiology outpatient for mildly reduced RV systolic function Follow with GI outpatient to consider endoscopy based on CT findings  Follow low density foci in liver and adrenal nodules outpatient  Discharge Diagnoses:  Principal Problem:   Anterior communicating artery aneurysm Active Problems:   ANXIETY DEPRESSION   Chronic obstructive pulmonary disease (HCC)   Chronic back pain   Nausea vomiting and diarrhea   Acute encephalopathy   Pericardial effusion   Discharge Condition: stable  Diet recommendation: heart healthy   Filed Weights   09/10/22 1205 09/12/22 0429  Weight: 54.4 kg 50.5 kg    History of present illness:   Jeffrey Mendez is Jeffrey Mendez 67 y.o. male with medical history significant for COPD, depression, anxiety, and chronic pain with opiate dependence who presents to the ED with chest pain, nausea, vomiting, diarrhea, and anxiety.  He was admitted for opiate withdrawal in setting of stopping his opiate meds.  Incidentally found to have ACA aneurysm.  Neurosurgery recommending outpatient follow up.  See below for additional details.  Improved from withdrawal standpoint after resumption of his pain meds.    Hospital Course:  Assessment and Plan:  1. Anterior communicating artery aneurysm  - 10 x 10 x 8 mm ACA aneurysm noted on CTA in ED  - discussed with Dr. Franky Macho who is planning to follow up with Jeffrey Mendez as an outpatient   2. Mildly Reduced RV Systolic Function  Abnormal CT Scan  Flattened atrial septum; small pericardial effusion  - CT with small pericardial effusion and nonspecific  flattening of anterior atrial septum - Check echocardiogram -> EF 60-65%, no RWMA, mildly reduced RVSF, trivial pericardial effusion - unclear significance of mildly reduced RVSF - will refer to cards outpatient   3. N/V/D  Opiate Withdrawal   - not clear why he stopped cold [place], on further discussion, sounds like he may have been titrated down by outpatient provider? - he decided to stop on his own accord abruptly, the reasons are not immediately clear to me - we've held long acting opiates here, continue to hold at discharge -> resume home oxycodone at discharge - follow with his pain management provider to discuss possible weaning further outpatient depending on patient's goals  - has prescriptions filled for morphine sulfate ER 30 mg tablet 120 to last 30 days and oxycodone 10 mg tablets 90 to last 30 days that were recently filled 08/17/2022   # Prominent Mucosal Folds in Stomach - possible gastritis, he's on PPI, consider GI follow up outpatient for EGD  4. COPD  - Not in exacerbation on admission  - Continue ICS-LAMA-LABA and as-needed DuoNebs      5. Acute encephalopathy  - Pt developed hallucinations in ED per report of his family  - Now resolved, was likely d/t Ativan     6. Anxiety, depression  - Continue Buspar, Zoloft, and as-needed hydroxyzine    Low denisty foci in liver, small nodules in adrenals - follow outpatient     Procedures:  Echo IMPRESSIONS     1. Left ventricular ejection fraction, by estimation, is 60 to 65%. The  left ventricle has normal function. The left ventricle has no regional  wall motion abnormalities. Left ventricular diastolic parameters were  normal.   2. Right ventricular systolic function is mildly reduced. The right  ventricular size is normal.   3. The mitral valve is normal in structure. Trivial mitral valve  regurgitation.   4. The aortic valve is tricuspid. Aortic valve regurgitation is not  visualized.   5. The inferior  vena cava is normal in size with greater than 50%  respiratory variability, suggesting right atrial pressure of 3 mmHg.   Comparison(s): The left ventricular function is unchanged.   Consultations: Neurosurgery   Discharge Exam: Vitals:   09/12/22 0816 09/12/22 1130  BP:  112/85  Pulse:  (!) 109  Resp:  18  Temp:  98.4 F (36.9 C)  SpO2: 96% 94%   No complaints Discussed discharge plan, discussed with brother  General: No acute distress. Cardiovascular: RRR Lungs: unlabored Abdomen: Soft, nontender, nondistended Neurological: Alert and oriented 3. Moves all extremities 4 with equal strength. Cranial nerves II through XII grossly intact. Extremities: No clubbing or cyanosis. No edema.   Discharge Instructions   Discharge Instructions     Ambulatory referral to Cardiology   Complete by: As directed    Call MD for:  difficulty breathing, headache or visual disturbances   Complete by: As directed    Call MD for:  extreme fatigue   Complete by: As directed    Call MD for:  hives   Complete by: As directed    Call MD for:  persistant dizziness or light-headedness   Complete by: As directed    Call MD for:  persistant nausea and vomiting   Complete by: As directed    Call MD for:  redness, tenderness, or signs of infection (pain, swelling, redness, odor or green/yellow discharge around incision site)   Complete by: As directed    Call MD for:  severe uncontrolled pain   Complete by: As directed    Call MD for:  temperature >100.4   Complete by: As directed    Diet - low sodium heart healthy   Complete by: As directed    Discharge instructions   Complete by: As directed    You were seen for opiate withdrawal.  You've improved as we've resumed short acting opiates for you.  At discharge, resume your oxycodone 10 mg tablets as prescribed and follow up with your providers at [facility] to discuss strategies for further weaning.  Don't stop cold [place] due to  the risk of withdrawal.  I think [name] portion of your anxiety is related to the reduction in your opiate dose.  You were incidentally found to have an anterior communicating artery aneurysm.  You should follow up with neurosurgery outpatient (they should call you about an appointment this week).  I'll attach their information to your discharge paperwork.  Your ultrasound of your heart showed Selin Eisler mildly reduced right ventricular systolic function.  This can be followed with cardiology outpatient.  I'll place Lynnette Pote referral.  Your CT scan of your stomach showed evidence of gastritis (inflammation of the stomach). Continue the protonix (acid blocker) and you should follow with your PCP outpatient to consider GI referral and follow up for Gladstone Rosas possible endoscopy. Your CT scan showed lesions in the liver that could be cysts or hemangiomas.  There were small nodules in both adrenals that were stable.  Follow these findings with your PCP.   You should get repeat chest imaging in [place] [place] few weeks with your PCP.  Return for  new, recurrent, or worsening symptoms.  Please ask your PCP to request records from this hospitalization so they know what was done and what the next steps will be.   Increase activity slowly   Complete by: As directed       Allergies as of 09/12/2022       Reactions   Sulfasalazine Hives, Swelling   Sulfamethoxazole    REACTION: unspecified   Opana [oxymorphone Hcl] Other (See Comments)   Heart went crazy Couldn't move        Medication List     STOP taking these medications    morphine 30 MG 12 hr tablet Commonly known as: MS CONTIN       TAKE these medications    aspirin EC 81 MG tablet Take 1 tablet (81 mg total) by mouth daily. Swallow whole.   atorvastatin 20 MG tablet Commonly known as: LIPITOR Take 1 tablet (20 mg total) by mouth daily.   Breztri Aerosphere 160-9-4.8 MCG/ACT Aero Generic drug: Budeson-Glycopyrrol-Formoterol Inhale 2 puffs into the lungs 2 (two)  times daily.   busPIRone 5 MG tablet Commonly known as: BUSPAR Take 1 tablet (5 mg total) by mouth 2 (two) times daily.   Glucosamine Sulfate 1500 MG Pack Take 1,500 mg by mouth daily.   hydrOXYzine 25 MG tablet Commonly known as: ATARAX Take 25 mg by mouth 3 (three) times daily as needed for anxiety or itching.   ipratropium-albuterol 0.5-2.5 (3) MG/3ML Soln Commonly known as: DUONEB Take 3 mLs by nebulization 4 (four) times daily as needed (shortness of breath).   IRON (FERROUS SULFATE) PO Take 1 tablet by mouth every other day.   methocarbamol 500 MG tablet Commonly known as: ROBAXIN Take 1 tablet (500 mg total) by mouth 3 (three) times daily.   multivitamin tablet Take 1 tablet by mouth daily.   Oxycodone HCl 10 MG Tabs Take 10 mg by mouth 4 (four) times daily as needed (pain).   pantoprazole 40 MG tablet Commonly known as: PROTONIX Take 1 tablet (40 mg total) by mouth 2 (two) times daily before Boy Delamater meal.   pyridOXINE 100 MG tablet Commonly known as: VITAMIN B6 Take 100 mg by mouth daily.   sertraline 25 MG tablet Commonly known as: ZOLOFT Take 25 mg by mouth daily.   tamsulosin 0.4 MG Caps capsule Commonly known as: FLOMAX TAKE ONE CAPSULE DAILY       Allergies  Allergen Reactions   Sulfasalazine Hives and Swelling   Sulfamethoxazole     REACTION: unspecified   Opana [Oxymorphone Hcl] Other (See Comments)    Heart went crazy Couldn't move    Follow-up Information     Jeffrey Earing, FNP Follow up.   Specialty: Family Medicine Why: call for Delaney Schnick follow up appointment Contact information: [address] [place] [address] [phone]         Coletta Memos, MD Follow up.   Specialty: Neurosurgery Why: Call to schedule an appointment for your anterior communicating artery aneurysm Contact information: [address] [place] [address] [phone]                  The results of significant diagnostics from  this hospitalization (including imaging, microbiology, ancillary and laboratory) are listed below for reference.    Significant Diagnostic Studies: ECHOCARDIOGRAM COMPLETE  Result Date: 09/11/2022    ECHOCARDIOGRAM REPORT   Patient Name:   Jeffrey Mendez Date of Exam: 09/11/2022 Medical Rec #:  [id number]  Height:       66.0 in Accession #:    [id number]    Weight:       120.0 lb Date of Birth:  08-06-1955    BSA:          1.610 m Patient Age:    66 years      BP:           122/86 mmHg Patient Gender: M             HR:           61 bpm. Exam Location:  Inpatient Procedure: 2D Echo, Cardiac Doppler and Color Doppler Indications:    Chest pain                 Pericardial effusion  History:        Patient has prior history of Echocardiogram examinations, most                 recent 08/14/2020. COPD; Signs/Symptoms:Chest Pain and N/V.                 Chronic pain, opiate dependence.  Sonographer:    Milda Smart Referring Phys: [id number] TIMOTHY S OPYD  Sonographer Comments: Image acquisition challenging due to patient body habitus and Image acquisition challenging due to respiratory motion. IMPRESSIONS  1. Left ventricular ejection fraction, by estimation, is 60 to 65%. The left ventricle has normal function. The left ventricle has no regional wall motion abnormalities. Left ventricular diastolic parameters were normal.  2. Right ventricular systolic function is mildly reduced. The right ventricular size is normal.  3. The mitral valve is normal in structure. Trivial mitral valve regurgitation.  4. The aortic valve is tricuspid. Aortic valve regurgitation is not visualized.  5. The inferior vena cava is normal in size with greater than 50% respiratory variability, suggesting right atrial pressure of 3 mmHg. Comparison(s): The left ventricular function is unchanged. FINDINGS  Left Ventricle: Left ventricular ejection fraction, by estimation, is 60 to 65%. The left ventricle has normal function. The left ventricle  has no regional wall motion abnormalities. The left ventricular internal cavity size was normal in size. There is  no left ventricular hypertrophy. Left ventricular diastolic parameters were normal. Right Ventricle: The right ventricular size is normal. Right vetricular wall thickness was not assessed. Right ventricular systolic function is mildly reduced. Left Atrium: Left atrial size was normal in size. Right Atrium: Right atrial size was normal in size. Pericardium: Trivial pericardial effusion is present. Mitral Valve: The mitral valve is normal in structure. Trivial mitral valve regurgitation. MV peak gradient, 4.2 mmHg. The mean mitral valve gradient is 2.0 mmHg. Tricuspid Valve: The tricuspid valve is normal in structure. Tricuspid valve regurgitation is mild. Aortic Valve: The aortic valve is tricuspid. Aortic valve regurgitation is not visualized. Pulmonic Valve: The pulmonic valve was normal in structure. Pulmonic valve regurgitation is not visualized. Aorta: The aortic root is normal in size and structure. Venous: The inferior vena cava is normal in size with greater than 50% respiratory variability, suggesting right atrial pressure of 3 mmHg. IAS/Shunts: No atrial level shunt detected by color flow Doppler.  LEFT VENTRICLE PLAX 2D LVIDd:         3.80 cm     Diastology LVIDs:         2.70 cm     LV e' medial:    8.70 cm/s LV PW:         0.80 cm  LV E/e' medial:  9.6 LV IVS:        0.80 cm     LV e' lateral:   11.70 cm/s LVOT diam:     2.10 cm     LV E/e' lateral: 7.1 LV SV:         95 LV SV Index:   59 LVOT Area:     3.46 cm  LV Volumes (MOD) LV vol d, MOD A2C: 82.8 ml LV vol d, MOD A4C: 76.6 ml LV vol s, MOD A2C: 25.4 ml LV vol s, MOD A4C: 25.9 ml LV SV MOD A2C:     57.4 ml LV SV MOD A4C:     76.6 ml LV SV MOD BP:      55.2 ml RIGHT VENTRICLE RV Basal diam:  3.70 cm RV Mid diam:    3.20 cm RV S prime:     16.00 cm/s TAPSE (M-mode): 2.1 cm LEFT ATRIUM             Index        RIGHT ATRIUM            Index LA diam:        1.80 cm 1.12 cm/m   RA Area:     15.60 cm LA Vol (A2C):   26.0 ml 16.15 ml/m  RA Volume:   40.70 ml  25.29 ml/m LA Vol (A4C):   16.0 ml 9.94 ml/m LA Biplane Vol: 20.6 ml 12.80 ml/m  AORTIC VALVE LVOT Vmax:   137.00 cm/s LVOT Vmean:  76.400 cm/s LVOT VTI:    0.273 m  AORTA Ao Root diam: 3.00 cm MITRAL VALVE               TRICUSPID VALVE MV Area (PHT): 2.26 cm    TR Peak grad:   38.2 mmHg MV Area VTI:   2.57 cm    TR Mean grad:   24.0 mmHg MV Peak grad:  4.2 mmHg    TR Vmax:        309.00 cm/s MV Mean grad:  2.0 mmHg    TR Vmean:       230.0 cm/s MV Vmax:       1.03 m/s MV Vmean:      56.0 cm/s   SHUNTS MV Decel Time: 335 msec    Systemic VTI:  0.27 m MV E velocity: 83.30 cm/s  Systemic Diam: 2.10 cm MV Janeisha Ryle velocity: 55.50 cm/s MV E/Argel Pablo ratio:  1.50 Dietrich Pates MD Electronically signed by Dietrich Pates MD Signature Date/Time: 09/11/2022/4:15:27 PM    Final    CT ANGIO HEAD NECK W WO CM  Result Date: 09/10/2022 CLINICAL DATA:  AVM/AVF, high flow vascular malformation EXAM: CT ANGIOGRAPHY HEAD AND NECK WITH AND WITHOUT CONTRAST TECHNIQUE: Multidetector CT imaging of the head and neck was performed using the standard protocol during bolus administration of intravenous contrast. Multiplanar CT image reconstructions and MIPs were obtained to evaluate the vascular anatomy. Carotid stenosis measurements (when applicable) are obtained utilizing NASCET criteria, using the distal internal carotid diameter as the denominator. RADIATION DOSE REDUCTION: This exam was performed according to the departmental dose-optimization program which includes automated exposure control, adjustment of the mA and/or kV according to patient size and/or use of iterative reconstruction technique. CONTRAST:  OMNIPAQUE IOHEXOL 350 MG/ML SOLN COMPARISON:  None Available. FINDINGS: CTA NECK FINDINGS Aortic arch: Minimal atherosclerotic calcifications are present the distal aortic arch and at the origin of the left  subclavian artery without  significant stenosis or aneurysm. Right carotid system: Right common carotid artery is within normal limits. The bifurcation is unremarkable. Moderate tortuosity is present in the cervical right ICA without significant stenosis. Left carotid system: The left common carotid artery is within normal limits. Mild atherosclerotic changes are present bifurcation proximal left ICA without significant stenosis. Moderate tortuosity is present in the more distal cervical left ICA without significant stenosis. Vertebral arteries: The right vertebral artery is the dominant vessel. Both vertebral arteries originate from the subclavian arteries without significant stenosis. There is some tortuosity in the V1 segments bilaterally without significant stenosis. No significant stenosis is present in either vertebral artery in the neck. Skeleton: Mild degenerative changes of the cervical spine are most evident at C3-4 and C6-7. No focal osseous lesions are present. Other neck: Monserrath Junio calcified 5 mm nodule is present in the right lobe of the thyroid. No significant adenopathy is present. The submandibular and parotid glands and ducts are within normal limits. No focal mucosal lesions are present. Upper chest: Moderate centrilobular emphysematous changes are present at both lung apices. No nodule or mass lesion is present. The thoracic inlet is within normal limits. Review of the MIP images confirms the above findings CTA HEAD FINDINGS Anterior circulation: The internal carotid arteries are within normal limits the skull base to the ICA termini bilaterally. The A1 and M1 segments are normal. And anterior communicating artery aneurysm measures 10 x 10 x 8 mm. Both A2 segments originate from the base of the aneurysm. The MCA bifurcations are within normal limits bilaterally. The more distal ACA branches and the MCA branches bilaterally are normal. Posterior circulation: The PICA origins are visualized and normal. The  vertebrobasilar junction and basilar artery are normal. The superior cerebellar arteries are patent bilaterally. Both posterior cerebral arteries originate from the basilar tip. The PCA branch vessels are normal bilaterally. Venous sinuses: The dural sinuses are patent. The straight sinus and deep cerebral veins are intact. Cortical veins are within normal limits. No significant vascular malformation is evident. Anatomic variants: None Review of the MIP images confirms the above findings IMPRESSION: 1. 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 Both A2 segments originate from the base of the aneurysm. 2. No other significant proximal stenosis, aneurysm, or branch vessel occlusion within the Circle of Willis. 3. Mild atherosclerotic changes at the proximal left ICA without significant stenosis. 4. Tortuosity of the cervical internal carotid arteries and V1 segments of the vertebral arteries bilaterally without significant stenosis. 5. Mild degenerative changes of the cervical spine are most evident at C3-4 and C6-7. 6. 5 mm calcified nodule in the right lobe of the thyroid. No follow-up imaging is recommended. Reference: J Am Coll Radiol. 2015 Feb;12(2): 143-50 7.  Emphysema (ICD10-J43.9). Electronically Signed   By: Marin Roberts M.D.   On: 09/10/2022 18:18   CT ABDOMEN PELVIS W CONTRAST  Result Date: 09/10/2022 CLINICAL DATA:  Weight loss, abdominal pain EXAM: CT ABDOMEN AND PELVIS WITH CONTRAST TECHNIQUE: Multidetector CT imaging of the abdomen and pelvis was performed using the standard protocol following bolus administration of intravenous contrast. RADIATION DOSE REDUCTION: This exam was performed according to the departmental dose-optimization program which includes automated exposure control, adjustment of the mA and/or kV according to patient size and/or use of iterative reconstruction technique. CONTRAST:  75mL OMNIPAQUE IOHEXOL 350 MG/ML SOLN COMPARISON:  05/28/2022 FINDINGS: Lower  chest: Emphysematous changes are noted in the lower lung fields. There is no focal consolidation. There is no pleural effusion.  Hepatobiliary: There is no dilation of bile ducts. There are few subcentimeter low-density lesions, possibly cysts or hemangiomas. Gallbladder is not distended. Pancreas: No focal abnormalities are seen. Spleen: Unremarkable. Adrenals/Urinary Tract: There are nodular densities in both adrenals with no significant interval change. There is no hydronephrosis. There are no renal or ureteral stones. Urinary bladder is unremarkable. Stomach/Bowel: Stomach is not distended. There is prominence of mucosal folds in stomach which appears less prominent. There is fluid in the lumen of small bowel loops and colon. Appendix is not distinctly seen. There is no pericecal inflammation. There is no significant wall thickening in colon. Vascular/Lymphatic: Calcifications are seen in aorta and its major branches. Reproductive: Coarse calcifications are seen in prostate. Other: There is no ascites or pneumoperitoneum. Musculoskeletal: Decrease in height of body of L1 vertebra has not changed. Decrease in height of the body of T11 vertebra has not changed. Rotoscoliosis is seen at thoracolumbar junction with convexity to the left. IMPRESSION: There is no evidence of intestinal obstruction or pneumoperitoneum. There is no hydronephrosis. There is prominence of mucosal folds in the stomach which may be due to incomplete distention all suggest gastritis. If clinically warranted, follow-up endoscopy may be considered. There is fluid in the lumen of small bowel loops and colon suggesting possible nonspecific enteritis. Subcentimeter low-density foci in liver may suggest cysts or hemangiomas. Small nodules in both adrenals appear stable. COPD. Aortic arteriosclerosis. Electronically Signed   By: Ernie Avena M.D.   On: 09/10/2022 15:46   CT Angio Chest Pulmonary Embolism (PE) W or WO Contrast  Result  Date: 09/10/2022 CLINICAL DATA:  weight loss. Abdominal pain EXAM: CT ANGIOGRAPHY CHEST WITH CONTRAST TECHNIQUE: Multidetector CT imaging of the chest was performed using the standard protocol during bolus administration of intravenous contrast. Multiplanar CT image reconstructions and MIPs were obtained to evaluate the vascular anatomy. RADIATION DOSE REDUCTION: This exam was performed according to the departmental dose-optimization program which includes automated exposure control, adjustment of the mA and/or kV according to patient size and/or use of iterative reconstruction technique. CONTRAST:  75mL OMNIPAQUE IOHEXOL 350 MG/ML SOLN COMPARISON:  None Available. FINDINGS: Cardiovascular: Satisfactory opacification of the pulmonary arteries to the segmental level. No evidence of pulmonary embolism. Normal heart size. Small pericardial effusion. There is nonspecific flattening of the anterior atrial septum Mediastinum/Nodes: No enlarged mediastinal, hilar, or axillary lymph nodes. Thyroid gland, trachea, and esophagus demonstrate no significant findings. Lungs/Pleura: Severe centrilobular emphysema. No focal consolidative opacity is visualized. No pleural effusion. No pneumothorax. Central airways are patent. Upper Abdomen: See separately dictated CT abdomen and pelvis for additional findings. Musculoskeletal: No chest wall abnormality. N no acute osseous abnormality. Redemonstrated are compression deformities of the T11 and T8 vertebral bodies. Review of the MIP images confirms the above findings. IMPRESSION: 1. No evidence of pulmonary embolism. 2. Severe centrilobular emphysema. 3. Small pericardial effusion. 4. Nonspecific flattening of the anterior atrial septum. Recommend correlation with echocardiogram. Emphysema (ICD10-J43.9). Electronically Signed   By: Lorenza Cambridge M.D.   On: 09/10/2022 15:24   CT Head Wo Contrast  Result Date: 09/10/2022 CLINICAL DATA:  Mental status change, unknown cause EXAM: CT  HEAD WITHOUT CONTRAST TECHNIQUE: Contiguous axial images were obtained from the base of the skull through the vertex without intravenous contrast. RADIATION DOSE REDUCTION: This exam was performed according to the departmental dose-optimization program which includes automated exposure control, adjustment of the mA and/or kV according to patient size and/or use of iterative reconstruction technique. COMPARISON:  MRA Head 11/02/05 FINDINGS:  Brain: No hemorrhage. No hydrocephalus. No extra-axial fluid collection. There is sequela of mild chronic microvascular ischemic change. No CT evidence of an acute cortical infarct. 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 Skull: Normal. Negative for fracture or focal lesion. Sinuses/Orbits: 2 no middle ear or mastoid effusion. Frothy secretions in the right maxillary sinus, which can be seen in the setting of acute sinusitis. Orbits are unremarkable. Other: None. IMPRESSION: 1. 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 Recommend further evaluation with Fredrick Dray CTA head. 2. No acute intracranial abnormality. 3. Frothy secretions in the right maxillary sinus, which can be seen in the setting of acute sinusitis. Electronically Signed   By: Lorenza Cambridge M.D.   On: 09/10/2022 15:14   DG Chest 2 View  Result Date: 09/10/2022 CLINICAL DATA:  Chest pain EXAM: CHEST - 2 VIEW COMPARISON:  09/09/2022 FINDINGS: Hyperinflation with chronic changes. Small right effusion. No consolidation, pneumothorax or edema. Normal cardiopericardial silhouette. Curvature of the spine. Osteopenia IMPRESSION: Hyperinflation with chronic changes with small right effusion. Electronically Signed   By: Karen Kays M.D.   On: 09/10/2022 13:41   DG Chest Portable 1 View  Result Date: 09/09/2022 CLINICAL DATA:  67 year old male with history of chest pain. EXAM: PORTABLE CHEST 1 VIEW COMPARISON:  Chest x-ray 03/23/2022. FINDINGS: Severe  emphysematous changes are again noted throughout the lungs bilaterally. Mild diffuse interstitial prominence and peribronchial cuffing, similar to prior studies, likely reflective of chronic bronchitis. Blunting of the right costophrenic sulcus is unchanged, suggesting chronic pleuroparenchymal scarring (less likely Olina Melfi trace right pleural effusion). No left pleural effusions. No pneumothorax. No evidence of pulmonary edema. Heart size is normal. Upper mediastinal contours are within normal limits. Atherosclerotic calcifications are noted in the thoracic aorta. IMPRESSION: 1. Radiographic appearance the chest is similar to prior studies, with imaging stigmata suggesting COPD, as above. No definite radiographic evidence of acute cardiopulmonary disease. 2. Aortic atherosclerosis. Electronically Signed   By: Trudie Reed M.D.   On: 09/09/2022 10:28    Microbiology: No results found for this or any previous visit (from the past 240 hour(s)).   Labs: Basic Metabolic Panel: Recent Labs  Lab 09/09/22 0940 09/10/22 1236 09/11/22 0212 09/12/22 0147  NA 138 140 137 136  K 4.0 3.5 3.5 3.2*  CL 104 99 105 105  CO2 28 21* 22 23  GLUCOSE 98 68* 84 79  BUN 12 14 16 14   CREATININE 0.85 0.98 0.95 0.86  CALCIUM 9.0 9.8 8.8* 8.5*  MG  --  1.7  --   --    Liver Function Tests: Recent Labs  Lab 09/10/22 1236  AST 54*  ALT 34  ALKPHOS 80  BILITOT 0.9  PROT 6.8  ALBUMIN 4.0   Recent Labs  Lab 09/10/22 1236  LIPASE 81*   No results for input(s): "AMMONIA" in the last 168 hours. CBC: Recent Labs  Lab 09/09/22 0940 09/10/22 1207 09/11/22 0212 09/12/22 0147  WBC 4.0 7.4 6.9 7.6  HGB 14.0 15.6 13.9 13.6  HCT 41.6 45.4 40.0 38.6*  MCV 91.4 90.3 89.3 85.0  PLT 151 213 196 206   Cardiac Enzymes: No results for input(s): "CKTOTAL", "CKMB", "CKMBINDEX", "TROPONINI" in the last 168 hours. BNP: BNP (last 3 results) No results for input(s): "BNP" in the last 8760 hours.  ProBNP (last 3  results) No results for input(s): "PROBNP" in the last 8760 hours.  CBG: No results for  input(s): "GLUCAP" in the last 168 hours.     Signed:  Lacretia Nicks MD.  [facility] 09/12/2022, 3:22 PM

## 2022-09-12 NOTE — Evaluation (Signed)
Physical Therapy Evaluation Patient Details Name: Jeffrey Mendez MRN: [id number] DOB: 1955/04/14 Today's Date: 09/12/2022  History of Present Illness  67 y.o. male presents to [facility] hospital on 09/10/2022 with increasing anxiety, nausea, vomiting, diarrhea, and chest pain. CTA demonstrates ACA aneurysm. PMH includes COPD, depression, anxiety, chronic pain with opiate dependence.  Clinical Impression  Pt presents to PT with mild deficits in balance, endurance, and gait, but is not far from his baseline. Pt reports feeling shaky but attributes this to anxiety, no losses of balance observed during session. Pt is encouraged to mobilize frequently. PT provides education on avoiding breath holding and activities that place high levels of strain on the patient in an effort to prevent hypertension with known ACA aneurysm. PT recommends discharge home when medically appropriate, no post-acute PT services indicated at this time.        Assistance Recommended at Discharge PRN  If plan is discharge home, recommend the following:  Can travel by private vehicle           Equipment Recommendations None recommended by PT  Recommendations for Other Services       Functional Status Assessment Patient has had a recent decline in their functional status and demonstrates the ability to make significant improvements in function in a reasonable and predictable amount of time. (but not far from baseline)     Precautions / Restrictions Precautions Precautions: None Restrictions Weight Bearing Restrictions: No      Mobility  Bed Mobility                    Transfers Overall transfer level: Independent Equipment used: None                    Ambulation/Gait Ambulation/Gait assistance: Modified independent (Device/Increase time) Gait Distance (Feet): 200 Feet Assistive device: None Gait Pattern/deviations: Step-through pattern Gait velocity: functional Gait velocity interpretation:  1.31 - 2.62 ft/sec, indicative of limited community ambulator   General Gait Details: slowed step-through gait  Stairs            Wheelchair Mobility     Tilt Bed    Modified Rankin (Stroke Patients Only)       Balance Overall balance assessment: Mild deficits observed, not formally tested                                           Pertinent Vitals/Pain Pain Assessment Pain Assessment: 0-10 Pain Score: 7  Pain Location: low back and groin Pain Descriptors / Indicators: Aching Pain Intervention(s): Monitored during session    Home Living Family/patient expects to be discharged to:: Private residence Living Arrangements: Parent;Other relatives Available Help at Discharge: Family;Available 24 hours/day Type of Home: Mobile home Home Access: Stairs to enter;Ramped entrance Entrance Stairs-Rails: None Entrance Stairs-Number of Steps: 2   Home Layout: One level Home Equipment: Cane - single Librarian, academic (2 wheels)      Prior Function Prior Level of Function : Independent/Modified Independent;Driving             Mobility Comments: ambulatory without DME       Hand Dominance        Extremity/Trunk Assessment   Upper Extremity Assessment Upper Extremity Assessment: Overall WFL for tasks assessed    Lower Extremity Assessment Lower Extremity Assessment: Overall WFL for tasks assessed    Cervical / Trunk Assessment Cervical /  Trunk Assessment: Normal  Communication   Communication: No difficulties  Cognition Arousal/Alertness: Awake/alert Behavior During Therapy: WFL for tasks assessed/performed Overall Cognitive Status: Within Functional Limits for tasks assessed                                          General Comments General comments (skin integrity, edema, etc.): VSS on RA, BP 126/92 post-ambulation    Exercises     Assessment/Plan    PT Assessment Patient needs continued PT services  PT  Problem List Decreased activity tolerance;Decreased mobility;Decreased balance;Pain       PT Treatment Interventions DME instruction;Functional mobility training;Therapeutic activities;Therapeutic exercise;Balance training;Neuromuscular re-education;Patient/family education;Gait training;Stair training    PT Goals (Current goals can be found in the Care Plan section)  Acute Rehab PT Goals Patient Stated Goal: to go home PT Goal Formulation: With patient Time For Goal Achievement: 09/26/22 Potential to Achieve Goals: Good Additional Goals Additional Goal #1: Pt will score >19/24 on the DGI to indicate a reduced risk for fall Additional Goal #2: Pt will score >45/56 on the BERG to indicate a reduced risk for fall    Frequency Min 1X/week     Co-evaluation               AM-PAC PT "6 Clicks" Mobility  Outcome Measure Help needed turning from your back to your side while in a flat bed without using bedrails?: None Help needed moving from lying on your back to sitting on the side of a flat bed without using bedrails?: None Help needed moving to and from a bed to a chair (including a wheelchair)?: None Help needed standing up from a chair using your arms (e.g., wheelchair or bedside chair)?: None Help needed to walk in hospital room?: None Help needed climbing 3-5 steps with a railing? : A Little 6 Click Score: 23    End of Session   Activity Tolerance: Patient tolerated treatment well Patient left: in bed;with call bell/phone within reach Nurse Communication: Mobility status PT Visit Diagnosis: Other abnormalities of gait and mobility (R26.89)    Time: 6295-2841 PT Time Calculation (min) (ACUTE ONLY): 8 min   Charges:   PT Evaluation $PT Eval Low Complexity: 1 Low   PT General Charges $$ ACUTE PT VISIT: 1 Visit         Arlyss Gandy, PT, DPT Acute Rehabilitation Office [phone]   Arlyss Gandy 09/12/2022, 2:42 PM

## 2022-09-14 ENCOUNTER — Telehealth: Payer: Self-pay

## 2022-09-14 NOTE — Transitions of Care (Post Inpatient/ED Visit) (Signed)
09/14/2022  Name: Jeffrey Mendez MRN: [id number] DOB: 23-Oct-1955  Today's TOC FU Call Status: Today's TOC FU Call Status:: Successful TOC FU Call Competed TOC FU Call Complete Date: 09/14/22  Transition Care Management Follow-up Telephone Call Date of Discharge: 09/12/22 Discharge Facility: Other ([facility]) Name of Other ([facility]) Discharge Facility: [facility] Type of Discharge: Inpatient Admission Primary Inpatient Discharge Diagnosis:: aneurysm How have you been since you were released from the hospital?: Same Any questions or concerns?: No  Items Reviewed: Did you receive and understand the discharge instructions provided?: Yes Medications obtained,verified, and reconciled?: Yes (Medications Reviewed) Any new allergies since your discharge?: No Dietary orders reviewed?: Yes Do you have support at home?: Yes People in Home: sibling(s)  Medications Reviewed Today: Medications Reviewed Today     Reviewed by Karena Addison, LPN (Licensed Practical Nurse) on 09/14/22 at 1151  Med List Status: <None>   Medication Order Taking? Sig Documenting Provider Last Dose Status Informant  aspirin EC 81 MG tablet [id number] No Take 1 tablet (81 mg total) by mouth daily. Swallow whole. Sande Rives, MD 09/09/2022 Active Family Member  atorvastatin (LIPITOR) 20 MG tablet [id number] No Take 1 tablet (20 mg total) by mouth daily. Sande Rives, MD Taking Active Family Member           Med Note Jomarie Longs, [place]   Thu Sep 10, 2022  5:14 PM) Sister not sure about this medication  Budeson-Glycopyrrol-Formoterol (BREZTRI AEROSPHERE) 160-9-4.8 MCG/ACT AERO [id number] No Inhale 2 puffs into the lungs 2 (two) times daily. [provider] 09/09/2022 Active Family Member  busPIRone (BUSPAR) 5 MG tablet [id number]  Take 1 tablet (5 mg total) by mouth 2 (two) times daily. Gabriel Earing, FNP  Active Family Member           Med Note Jomarie Longs, [place]   Thu Sep 10, 2022  5:15 PM)  Sister not sure about this medicine  Glucosamine Sulfate 1500 MG PACK [id number] No Take 1,500 mg by mouth daily. [provider] 09/09/2022 Active Family Member  hydrOXYzine (ATARAX) 25 MG tablet [id number]  Take 25 mg by mouth 3 (three) times daily as needed for anxiety or itching. [provider]  Active Family Member           Med Note Jomarie Longs, [place]   Thu Sep 10, 2022  5:16 PM) Sister not sure about this medicine  ipratropium-albuterol (DUONEB) 0.5-2.5 (3) MG/3ML SOLN [id number] No Take 3 mLs by nebulization 4 (four) times daily as needed (shortness of breath). [provider] 06/03/2022 Active Family Member  IRON, FERROUS SULFATE, PO [id number] No Take 1 tablet by mouth every other day. [provider] 09/09/2022 Active Family Member  methocarbamol (ROBAXIN) 500 MG tablet [id number] No Take 1 tablet (500 mg total) by mouth 3 (three) times daily. Pauline Aus, PA-C 09/09/2022 Active Family Member  Multiple Vitamin (MULTIVITAMIN) tablet [id number] No Take 1 tablet by mouth daily.   [provider] 09/09/2022 Active Family Member  Oxycodone HCl 10 MG TABS [id number] No Take 10 mg by mouth 4 (four) times daily as needed (pain). [provider] 08/27/2022 Active Family Member  pantoprazole (PROTONIX) 40 MG tablet [id number] No Take 1 tablet (40 mg total) by mouth 2 (two) times daily before a meal. Gabriel Earing, FNP 09/09/2022 Active Family Member  pyridOXINE (VITAMIN B6) 100 MG tablet [id number] No Take 100 mg by mouth daily. [provider] 09/09/2022 Active Family Member  sertraline (ZOLOFT) 25 MG tablet [id number]  Take  25 mg by mouth daily. [provider]  Active Family Member           Med Note Lenor Derrick   Thu Sep 10, 2022  5:17 PM) Sister not sure about this medicine  tamsulosin (FLOMAX) 0.4 MG CAPS capsule [id number] No TAKE ONE CAPSULE DAILY Raliegh Ip, DO 09/09/2022 Active Family Member            Home Care  and Equipment/Supplies: Were Home Health Services Ordered?: NA Any new equipment or medical supplies ordered?: NA  Functional Questionnaire: Do you need assistance with bathing/showering or dressing?: No Do you need assistance with meal preparation?: No Do you need assistance with eating?: No Do you have difficulty maintaining continence: No Do you need assistance with getting out of bed/getting out of a chair/moving?: No Do you have difficulty managing or taking your medications?: No  Follow up appointments reviewed: PCP Follow-up appointment confirmed?: No (declined appt) Specialist Hospital Follow-up appointment confirmed?: No Reason Specialist Follow-Up Not Confirmed: Patient has Specialist Provider Number and will Call for Appointment Do you need transportation to your follow-up appointment?: No Do you understand care options if your condition(s) worsen?: Yes-patient verbalized understanding    SIGNATURE Karena Addison, LPN [facility] Nurse Health Advisor Direct Dial [phone]
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
## 2022-09-22 ENCOUNTER — Other Ambulatory Visit: Payer: Self-pay

## 2022-09-22 DIAGNOSIS — R1319 Other dysphagia: Secondary | ICD-10-CM

## 2022-09-22 DIAGNOSIS — R634 Abnormal weight loss: Secondary | ICD-10-CM

## 2022-09-23 ENCOUNTER — Encounter: Payer: Self-pay | Admitting: Cardiovascular Disease

## 2022-09-23 ENCOUNTER — Ambulatory Visit: Payer: Medicare HMO | Attending: Cardiovascular Disease | Admitting: Cardiovascular Disease

## 2022-09-23 VITALS — BP 120/72 | HR 88 | Ht 66.0 in | Wt 114.0 lb

## 2022-09-23 DIAGNOSIS — I517 Cardiomegaly: Secondary | ICD-10-CM

## 2022-09-23 DIAGNOSIS — R931 Abnormal findings on diagnostic imaging of heart and coronary circulation: Secondary | ICD-10-CM | POA: Diagnosis not present

## 2022-09-23 DIAGNOSIS — E782 Mixed hyperlipidemia: Secondary | ICD-10-CM | POA: Diagnosis not present

## 2022-09-23 DIAGNOSIS — R072 Precordial pain: Secondary | ICD-10-CM

## 2022-09-23 MED ORDER — METOPROLOL TARTRATE 100 MG PO TABS: 100.0000 mg | ORAL_TABLET | Freq: Once | ORAL | 0 refills | Status: DC

## 2022-09-23 NOTE — Patient Instructions (Addendum)
Medication Instructions:  TAKE METOPROLOL 2 HOURS PRIOR TO CARDIAC TESTING. *If you need a refill on your cardiac medications before your next appointment, please call your pharmacy*   Lab Work: LIPID TODAY. NO BMP NEEDED. If you have labs (blood work) drawn today and your tests are completely normal, you will receive your results only by: MyChart Message (if you have MyChart) OR A paper copy in the mail If you have any lab test that is abnormal or we need to change your treatment, we will call you to review the results.   Testing/Procedures: CORONARY CTA WILL BE SCHEDULED AT [facility].RADIOLOGY DEPARTMENT WILL CALL TO SCHEDULE APPOINTMENT.   Follow-Up: At [facility], you and your health needs are our priority.  As part of our continuing mission to provide you with exceptional heart care, we have created designated Provider Care Teams.  These Care Teams include your primary Cardiologist (physician) and Advanced Practice Providers (APPs -  Physician Assistants and Nurse Practitioners) who all work together to provide you with the care you need, when you need it.   Your next appointment:   6 month(s)  Provider:   Reatha Harps, MD     Other Instructions   Your cardiac CT will be scheduled at the below location:   [facility] [address] [place], [place] [address] [phone]   please arrive at the [facility] and Children's Entrance (Entrance C2) of [facility] 30 minutes prior to test start time. You can use the FREE valet parking offered at entrance C (encouraged to control the heart rate for the test)  Proceed to the [facility] Radiology Department (first floor) to check-in and test prep.  All radiology patients and guests should use entrance C2 at [facility], accessed from [facility], even though the hospital's physical address listed is [address]     Please follow these instructions carefully  (unless otherwise directed):  An IV will be required for this test and Nitroglycerin will be given.  Hold all erectile dysfunction medications at least 3 days (72 hrs) prior to test. (Ie viagra, cialis, sildenafil, tadalafil, etc)   On the Night Before the Test: Be sure to Drink plenty of water. Do not consume any caffeinated/decaffeinated beverages or chocolate 12 hours prior to your test. Do not take any antihistamines 12 hours prior to your test.  On the Day of the Test: Drink plenty of water until 1 hour prior to the test. Do not eat any food 1 hour prior to test. You may take your regular medications prior to the test.  Take metoprolol 100MG (Lopressor) two hours prior to test.  After the Test: Drink plenty of water. After receiving IV contrast, you may experience a mild flushed feeling. This is normal. On occasion, you may experience a mild rash up to 24 hours after the test. This is not dangerous. If this occurs, you can take Benadryl 25 mg and increase your fluid intake. If you experience trouble breathing, this can be serious. If it is severe call 911 IMMEDIATELY. If it is mild, please call our office.   We will call to schedule your test 2-4 weeks out understanding that some insurance companies will need an authorization prior to the service being performed.   For more information and frequently asked questions, please visit our website : [email or web address]  For non-scheduling related questions, please contact the cardiac imaging nurse navigator should you have any questions/concerns: Cardiac Imaging Nurse Navigators  Direct Office Dial: [phone]   For scheduling needs, including cancellations and rescheduling, please call [place], [phone].

## 2022-09-28 ENCOUNTER — Other Ambulatory Visit: Payer: Self-pay | Admitting: Family Medicine

## 2022-10-02 ENCOUNTER — Other Ambulatory Visit (HOSPITAL_COMMUNITY): Payer: Self-pay | Admitting: Neurosurgery

## 2022-10-02 DIAGNOSIS — I671 Cerebral aneurysm, nonruptured: Secondary | ICD-10-CM

## 2022-10-05 ENCOUNTER — Ambulatory Visit (INDEPENDENT_AMBULATORY_CARE_PROVIDER_SITE_OTHER): Payer: Medicare HMO | Admitting: Family Medicine

## 2022-10-05 ENCOUNTER — Other Ambulatory Visit: Payer: Self-pay | Admitting: Neurosurgery

## 2022-10-05 ENCOUNTER — Telehealth (HOSPITAL_COMMUNITY): Payer: Self-pay | Admitting: *Deleted

## 2022-10-05 ENCOUNTER — Encounter: Payer: Self-pay | Admitting: Family Medicine

## 2022-10-05 VITALS — BP 99/69 | HR 73 | Temp 98.3°F | Ht 66.0 in | Wt 118.2 lb

## 2022-10-05 DIAGNOSIS — I517 Cardiomegaly: Secondary | ICD-10-CM

## 2022-10-05 DIAGNOSIS — J449 Chronic obstructive pulmonary disease, unspecified: Secondary | ICD-10-CM

## 2022-10-05 DIAGNOSIS — Z681 Body mass index (BMI) 19 or less, adult: Secondary | ICD-10-CM

## 2022-10-05 DIAGNOSIS — G8929 Other chronic pain: Secondary | ICD-10-CM

## 2022-10-05 DIAGNOSIS — R300 Dysuria: Secondary | ICD-10-CM

## 2022-10-05 DIAGNOSIS — M545 Low back pain, unspecified: Secondary | ICD-10-CM

## 2022-10-05 DIAGNOSIS — F5104 Psychophysiologic insomnia: Secondary | ICD-10-CM

## 2022-10-05 DIAGNOSIS — E782 Mixed hyperlipidemia: Secondary | ICD-10-CM

## 2022-10-05 DIAGNOSIS — Z79891 Long term (current) use of opiate analgesic: Secondary | ICD-10-CM

## 2022-10-05 DIAGNOSIS — F419 Anxiety disorder, unspecified: Secondary | ICD-10-CM | POA: Diagnosis not present

## 2022-10-05 DIAGNOSIS — K219 Gastro-esophageal reflux disease without esophagitis: Secondary | ICD-10-CM

## 2022-10-05 DIAGNOSIS — I671 Cerebral aneurysm, nonruptured: Secondary | ICD-10-CM

## 2022-10-05 MED ORDER — MIRTAZAPINE 7.5 MG PO TABS: 7.5000 mg | ORAL_TABLET | Freq: Every day | ORAL | 3 refills | Status: DC

## 2022-10-05 NOTE — Progress Notes (Unsigned)
   Established Patient Office Visit  Subjective   Patient ID: Jeffrey Mendez, male    DOB: 11-Apr-1955  Age: 67 y.o. MRN: [id number]  Chief Complaint  Patient presents with  . Medical Management of Chronic Issues  . Hyperlipidemia    HPI Here with his sister today.  He has follow up with neurosurgery for brain aneursym.   Has appt with cardiology for futher work up of chest pain.   He will also have an endoscopy next week for chronic stomach pain. He continues to have nausea. No fever, diarrhea, or consipation.  Appeitie ahs been improving. He has gained some weight.        10/05/2022    3:27 PM 07/22/2022   10:33 AM 07/03/2022    1:59 PM  Depression screen PHQ 2/9  Decreased Interest 0 0 1  Down, Depressed, Hopeless 0 0 1  PHQ - 2 Score 0 0 2  Altered sleeping 1 0 1  Tired, decreased energy 3 0 1  Change in appetite 0 0 0  Feeling bad or failure about yourself  2 0 0  Trouble concentrating 0 0 0  Moving slowly or fidgety/restless 0 0 0  Suicidal thoughts 0 0 0  PHQ-9 Score 6 0 4  Difficult doing work/chores Not difficult at all Not difficult at all Not difficult at all      10/05/2022    3:27 PM 07/03/2022    1:59 PM  GAD 7 : Generalized Anxiety Score  Nervous, Anxious, on Edge 3 1  Control/stop worrying 0 0  Worry too much - different things 0 0  Trouble relaxing 0 0  Restless 1 2  Easily annoyed or irritable 0 1  Afraid - awful might happen 0 1  Total GAD 7 Score 4 5  Anxiety Difficulty Not difficult at all Not difficult at all     {History (Optional):23778}  ROS    Objective:     BP 99/69   Pulse 73   Temp 98.3 F (36.8 C) (Temporal)   Ht 5\' 6"  (1.676 m)   Wt 118 lb 4 oz (53.6 kg)   SpO2 98%   BMI 19.09 kg/m  BP Readings from Last 3 Encounters:  10/05/22 99/69  09/23/22 120/72  09/12/22 112/85   Wt Readings from Last 3 Encounters:  10/05/22 118 lb 4 oz (53.6 kg)  09/23/22 114 lb (51.7 kg)  09/12/22 111 lb 4.8 oz (50.5 kg)      Physical  Exam   No results found for any visits on 10/05/22.  {Labs (Optional):23779}  The 10-year ASCVD risk score (Arnett DK, et al., 2019) is: 10.2%    Assessment & Plan:   Problem List Items Addressed This Visit   None Visit Diagnoses     Dysuria    -  Primary   Relevant Orders   Urinalysis, Routine w reflex microscopic       No follow-ups on file.    Gabriel Earing, FNP

## 2022-10-05 NOTE — Telephone Encounter (Signed)
Reaching out to patient to offer assistance regarding upcoming cardiac imaging study; pt verbalizes understanding of appt date/time, parking situation and where to check in, pre-test NPO status and medications ordered, and verified current allergies; name and call back number provided for further questions should they arise Hayley Sharpe RN Navigator Cardiac Imaging [facility] [phone] office [phone] cell  

## 2022-10-06 ENCOUNTER — Other Ambulatory Visit: Payer: Self-pay | Admitting: Neurosurgery

## 2022-10-06 ENCOUNTER — Other Ambulatory Visit (HOSPITAL_COMMUNITY): Payer: Medicare HMO

## 2022-10-06 ENCOUNTER — Encounter (HOSPITAL_COMMUNITY): Payer: Self-pay | Admitting: Gastroenterology

## 2022-10-06 ENCOUNTER — Ambulatory Visit (HOSPITAL_COMMUNITY)
Admission: RE | Admit: 2022-10-06 | Discharge: 2022-10-06 | Disposition: A | Discharge status: Home / Self Care | Payer: Medicare HMO | Source: Ambulatory Visit | Attending: Cardiovascular Disease | Admitting: Cardiovascular Disease

## 2022-10-06 DIAGNOSIS — R072 Precordial pain: Secondary | ICD-10-CM | POA: Diagnosis present

## 2022-10-06 DIAGNOSIS — R931 Abnormal findings on diagnostic imaging of heart and coronary circulation: Secondary | ICD-10-CM | POA: Diagnosis present

## 2022-10-06 LAB — CBC WITH DIFFERENTIAL/PLATELET
Basophils Absolute: 0.1 10*3/uL (ref 0.0–0.2)
Basos: 1 %
EOS (ABSOLUTE): 0.2 10*3/uL (ref 0.0–0.4)
Eos: 2 %
Hematocrit: 40 % (ref 37.5–51.0)
Hemoglobin: 13.6 g/dL (ref 13.0–17.7)
Immature Grans (Abs): 0 10*3/uL (ref 0.0–0.1)
Immature Granulocytes: 0 %
Lymphocytes Absolute: 2.7 10*3/uL (ref 0.7–3.1)
Lymphs: 27 %
MCH: 31.1 pg (ref 26.6–33.0)
MCHC: 34 g/dL (ref 31.5–35.7)
MCV: 91 fL (ref 79–97)
Monocytes Absolute: 0.8 10*3/uL (ref 0.1–0.9)
Monocytes: 8 %
Neutrophils Absolute: 6.2 10*3/uL (ref 1.4–7.0)
Neutrophils: 62 %
Platelets: 214 10*3/uL (ref 150–450)
RBC: 4.38 x10E6/uL (ref 4.14–5.80)
RDW: 13.2 % (ref 11.6–15.4)
WBC: 10 10*3/uL (ref 3.4–10.8)

## 2022-10-06 LAB — CMP14+EGFR
ALT: 22 IU/L (ref 0–44)
AST: 22 IU/L (ref 0–40)
Albumin: 4 g/dL (ref 3.9–4.9)
Alkaline Phosphatase: 96 IU/L (ref 44–121)
BUN/Creatinine Ratio: 15 (ref 10–24)
BUN: 14 mg/dL (ref 8–27)
Bilirubin Total: 0.2 mg/dL (ref 0.0–1.2)
CO2: 26 mmol/L (ref 20–29)
Calcium: 9.6 mg/dL (ref 8.6–10.2)
Chloride: 99 mmol/L (ref 96–106)
Creatinine, Ser: 0.92 mg/dL (ref 0.76–1.27)
Globulin, Total: 2 g/dL (ref 1.5–4.5)
Glucose: 85 mg/dL (ref 70–99)
Potassium: 4.8 mmol/L (ref 3.5–5.2)
Sodium: 138 mmol/L (ref 134–144)
Total Protein: 6 g/dL (ref 6.0–8.5)
eGFR: 92 mL/min/{1.73_m2} (ref >59)

## 2022-10-06 MED ORDER — IOHEXOL 350 MG/ML SOLN
100.0000 mL | Freq: Once | INTRAVENOUS | Status: AC | PRN
  Administered 2022-10-06: 100 mL via INTRAVENOUS

## 2022-10-06 MED ORDER — NITROGLYCERIN 0.4 MG SL SUBL
0.8000 mg | SUBLINGUAL_TABLET | Freq: Once | SUBLINGUAL | Status: AC
  Administered 2022-10-06: 0.8 mg via SUBLINGUAL

## 2022-10-06 MED ORDER — NITROGLYCERIN 0.4 MG SL SUBL
SUBLINGUAL_TABLET | SUBLINGUAL | Status: AC
  Filled 2022-10-06: qty 2

## 2022-10-06 NOTE — Progress Notes (Signed)
Jeffrey Mendez  Prep instructions-n/a  PCP- Harlow Mares NP Cardiologist-[place]'Ne[place]  EKG-09/10/22 Echo-09/11/22 Cath-n/a Stress- 08/08/20 ICD/PM-n/a Blood thinner-n/a  GLP-1- n/a   Hx: COPD, Seizure, Thrombocytopenia. 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 He is planning to see neuro to have a arteriogram on 8/23. His last visit with cardiology was 8/7 and was still having some slight chest pains so they recommended a coronary CT which is for today 10/06/22.  Anesthesia Review: Yes

## 2022-10-07 ENCOUNTER — Other Ambulatory Visit: Payer: Medicare HMO

## 2022-10-08 LAB — URINALYSIS, ROUTINE W REFLEX MICROSCOPIC
Bilirubin, UA: NEGATIVE
Glucose, UA: NEGATIVE
Ketones, UA: NEGATIVE
Leukocytes,UA: NEGATIVE
Nitrite, UA: NEGATIVE
Protein,UA: NEGATIVE
RBC, UA: NEGATIVE
Specific Gravity, UA: 1.018 (ref 1.005–1.030)
Urobilinogen, Ur: 0.2 mg/dL (ref 0.2–1.0)
pH, UA: 6 (ref 5.0–7.5)

## 2022-10-09 ENCOUNTER — Other Ambulatory Visit (HOSPITAL_COMMUNITY): Payer: Self-pay | Admitting: Neurosurgery

## 2022-10-09 ENCOUNTER — Encounter (HOSPITAL_COMMUNITY): Payer: Self-pay

## 2022-10-09 ENCOUNTER — Ambulatory Visit (HOSPITAL_COMMUNITY)
Admission: RE | Admit: 2022-10-09 | Discharge: 2022-10-09 | Disposition: A | Discharge status: Home / Self Care | Payer: Medicare HMO | Source: Ambulatory Visit | Attending: Neurosurgery | Admitting: Neurosurgery

## 2022-10-09 ENCOUNTER — Other Ambulatory Visit: Payer: Self-pay

## 2022-10-09 DIAGNOSIS — J449 Chronic obstructive pulmonary disease, unspecified: Secondary | ICD-10-CM | POA: Diagnosis not present

## 2022-10-09 DIAGNOSIS — I671 Cerebral aneurysm, nonruptured: Secondary | ICD-10-CM

## 2022-10-09 DIAGNOSIS — F17210 Nicotine dependence, cigarettes, uncomplicated: Secondary | ICD-10-CM | POA: Diagnosis not present

## 2022-10-09 HISTORY — PX: IR ANGIO VERTEBRAL SEL VERTEBRAL UNI L MOD SED: IMG5367

## 2022-10-09 HISTORY — PX: IR 3D INDEPENDENT WKST: IMG2385

## 2022-10-09 HISTORY — PX: IR ANGIO INTRA EXTRACRAN SEL INTERNAL CAROTID BILAT MOD SED: IMG5363

## 2022-10-09 LAB — CBC WITH DIFFERENTIAL/PLATELET
Abs Immature Granulocytes: 0.02 10*3/uL (ref 0.00–0.07)
Basophils Absolute: 0 10*3/uL (ref 0.0–0.1)
Basophils Relative: 0 %
Eosinophils Absolute: 0.2 10*3/uL (ref 0.0–0.5)
Eosinophils Relative: 3 %
HCT: 40.9 % (ref 39.0–52.0)
Hemoglobin: 13.5 g/dL (ref 13.0–17.0)
Immature Granulocytes: 0 %
Lymphocytes Relative: 30 %
Lymphs Abs: 2 10*3/uL (ref 0.7–4.0)
MCH: 30.5 pg (ref 26.0–34.0)
MCHC: 33 g/dL (ref 30.0–36.0)
MCV: 92.3 fL (ref 80.0–100.0)
Monocytes Absolute: 0.6 10*3/uL (ref 0.1–1.0)
Monocytes Relative: 9 %
Neutro Abs: 3.9 10*3/uL (ref 1.7–7.7)
Neutrophils Relative %: 58 %
Platelets: 174 10*3/uL (ref 150–400)
RBC: 4.43 MIL/uL (ref 4.22–5.81)
RDW: 13.1 % (ref 11.5–15.5)
WBC: 6.7 10*3/uL (ref 4.0–10.5)
nRBC: 0 % (ref 0.0–0.2)

## 2022-10-09 LAB — BASIC METABOLIC PANEL
Anion gap: 9 (ref 5–15)
BUN: 15 mg/dL (ref 8–23)
CO2: 28 mmol/L (ref 22–32)
Calcium: 8.9 mg/dL (ref 8.9–10.3)
Chloride: 101 mmol/L (ref 98–111)
Creatinine, Ser: 1.04 mg/dL (ref 0.61–1.24)
GFR, Estimated: >60 mL/min (ref >60)
Glucose, Bld: 85 mg/dL (ref 70–99)
Potassium: 4 mmol/L (ref 3.5–5.1)
Sodium: 138 mmol/L (ref 135–145)

## 2022-10-09 LAB — PROTIME-INR
INR: 1 (ref 0.8–1.2)
Prothrombin Time: 13.5 seconds (ref 11.4–15.2)

## 2022-10-09 MED ORDER — HEPARIN SODIUM (PORCINE) 1000 UNIT/ML IJ SOLN
INTRAMUSCULAR | Status: AC | PRN
  Administered 2022-10-09: 2000 [IU] via INTRAVENOUS

## 2022-10-09 MED ORDER — LIDOCAINE HCL 1 % IJ SOLN
INTRAMUSCULAR | Status: AC
  Filled 2022-10-09: qty 20

## 2022-10-09 MED ORDER — IOHEXOL 300 MG/ML  SOLN
100.0000 mL | Freq: Once | INTRAMUSCULAR | Status: AC | PRN
  Administered 2022-10-09: 25 mL via INTRA_ARTERIAL

## 2022-10-09 MED ORDER — FENTANYL CITRATE (PF) 100 MCG/2ML IJ SOLN
INTRAMUSCULAR | Status: AC
  Filled 2022-10-09: qty 2

## 2022-10-09 MED ORDER — HEPARIN SODIUM (PORCINE) 1000 UNIT/ML IJ SOLN
INTRAMUSCULAR | Status: AC
  Filled 2022-10-09: qty 10

## 2022-10-09 MED ORDER — MIDAZOLAM HCL 2 MG/2ML IJ SOLN
INTRAMUSCULAR | Status: AC | PRN
Start: 2022-10-09 — End: 2022-10-09
  Administered 2022-10-09: .5 mg via INTRAVENOUS

## 2022-10-09 MED ORDER — HYDROCODONE-ACETAMINOPHEN 5-325 MG PO TABS: 1.0000 | ORAL_TABLET | ORAL | Status: DC | PRN

## 2022-10-09 MED ORDER — CEFAZOLIN SODIUM-DEXTROSE 2-4 GM/100ML-% IV SOLN: 2.0000 g | INTRAVENOUS | Status: DC

## 2022-10-09 MED ORDER — IOHEXOL 300 MG/ML  SOLN
100.0000 mL | Freq: Once | INTRAMUSCULAR | Status: AC | PRN
  Administered 2022-10-09: 30 mL via INTRA_ARTERIAL

## 2022-10-09 MED ORDER — MIDAZOLAM HCL 2 MG/2ML IJ SOLN
INTRAMUSCULAR | Status: AC
  Filled 2022-10-09: qty 2

## 2022-10-09 MED ORDER — SODIUM CHLORIDE 0.9 % IV SOLN: INTRAVENOUS | Status: DC

## 2022-10-09 MED ORDER — FENTANYL CITRATE (PF) 100 MCG/2ML IJ SOLN
INTRAMUSCULAR | Status: AC | PRN
Start: 2022-10-09 — End: 2022-10-09
  Administered 2022-10-09: 25 ug via INTRAVENOUS

## 2022-10-09 NOTE — Progress Notes (Signed)
 Verified with MD
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
## 2022-10-09 NOTE — Progress Notes (Signed)
Pt ambulated to and from bathroom with no signs of oozing from right groin site

## 2022-10-13 ENCOUNTER — Ambulatory Visit (HOSPITAL_COMMUNITY): Admit: 2022-10-13 | Payer: Medicare HMO | Admitting: Gastroenterology

## 2022-10-13 SURGERY — ESOPHAGOGASTRODUODENOSCOPY (EGD) WITH PROPOFOL
Anesthesia: Monitor Anesthesia Care

## 2022-10-22 ENCOUNTER — Encounter: Payer: Self-pay | Admitting: Family Medicine

## 2022-10-22 ENCOUNTER — Ambulatory Visit (INDEPENDENT_AMBULATORY_CARE_PROVIDER_SITE_OTHER): Payer: Medicare HMO | Admitting: Family Medicine

## 2022-10-22 VITALS — BP 125/83 | HR 68 | Temp 98.9°F | Ht 66.0 in | Wt 127.4 lb

## 2022-10-22 DIAGNOSIS — R600 Localized edema: Secondary | ICD-10-CM | POA: Diagnosis not present

## 2022-10-22 MED ORDER — FUROSEMIDE 20 MG PO TABS
20.0000 mg | ORAL_TABLET | Freq: Every day | ORAL | 1 refills | Status: DC
Start: 2022-10-22 — End: 2023-05-03

## 2022-10-22 MED ORDER — POTASSIUM CHLORIDE CRYS ER 10 MEQ PO TBCR: 10.0000 meq | EXTENDED_RELEASE_TABLET | Freq: Every day | ORAL | 1 refills | Status: DC

## 2022-10-22 NOTE — Progress Notes (Signed)
   Acute Office Visit  Subjective:     Patient ID: Jeffrey Mendez, male    DOB: 05-30-1955, 67 y.o.   MRN: [id number]  Chief Complaint  Patient presents with   Edema    HPI Patient is in today for peripheral edema of bilateral lower legs. He reports that this has been ongoing for ears, gradually worsening. Reports pitting edema by the end of the day. Chronic shortness of breath is stable. He has had a echo recently with normal EF. Denies chest pain, orthopnea. He wears compression socks sometimes without relief. Reports that he had a follow up with neurosurgery yesterday. He had swelling at the appt and they requested that he see his PCP for a fluid pill prior to doing his surgery.  ROS As per HPI.     Objective:    BP 125/83   Pulse 68   Temp 98.9 F (37.2 C) (Temporal)   Ht 5\' 6"  (1.676 m)   Wt 127 lb 6 oz (57.8 kg)   SpO2 98%   BMI 20.56 kg/m    Physical Exam Vitals and nursing note reviewed.  Constitutional:      General: He is not in acute distress.    Appearance: He is not ill-appearing, toxic-appearing or diaphoretic.  Cardiovascular:     Rate and Rhythm: Normal rate and regular rhythm.     Heart sounds: Normal heart sounds. No murmur heard. Pulmonary:     Effort: Pulmonary effort is normal. No respiratory distress.     Breath sounds: Normal breath sounds. No wheezing or rhonchi.  Abdominal:     General: Bowel sounds are normal. There is no distension.     Palpations: Abdomen is soft.     Tenderness: There is no abdominal tenderness. There is no guarding or rebound.  Musculoskeletal:     Cervical back: Neck supple. No rigidity.     Right lower leg: 1+ Edema present.     Left lower leg: 1+ Edema present.  Skin:    General: Skin is warm and dry.     Comments: Chronic bilateral lower leg dermatitis present.   Neurological:     General: No focal deficit present.     Mental Status: He is alert and oriented to person, place, and time.  Psychiatric:         Mood and Affect: Mood normal.        Behavior: Behavior normal.     No results found for any visits on 10/22/22.      Assessment & Plan:   Jlynn was seen today for edema.  Diagnoses and all orders for this visit:  Peripheral edema Will start daily low dose of lasix. Return in 2 weeks for BMP. Wear compression socks. He will monitor BP at home and notify for lower readings, dizziness etc.  -     furosemide (LASIX) 20 MG tablet; Take 1 tablet (20 mg total) by mouth daily. -     potassium chloride (KLOR-CON M) 10 MEQ tablet; Take 1 tablet (10 mEq total) by mouth daily. -     BMP8+EGFR; Future  Return to office for new or worsening symptoms, or if symptoms persist.   The patient indicates understanding of these issues and agrees with the plan.   Gabriel Earing, FNP

## 2022-10-26 ENCOUNTER — Other Ambulatory Visit (HOSPITAL_COMMUNITY): Payer: Self-pay | Admitting: Neurosurgery

## 2022-10-26 ENCOUNTER — Other Ambulatory Visit: Payer: Self-pay | Admitting: Neurosurgery

## 2022-10-26 DIAGNOSIS — I671 Cerebral aneurysm, nonruptured: Secondary | ICD-10-CM

## 2022-10-27 NOTE — Telephone Encounter (Signed)
Called and left message for patient that Dr. Milas Hock next opening at the hospital is 10-17th. Asked patient to call back to discuss

## 2022-10-27 NOTE — Telephone Encounter (Signed)
Patients sister called looking to reschedule the procedure at the hospital but it would need to be done prior to 11/10/22 if possible. Please advise

## 2022-10-29 ENCOUNTER — Telehealth: Payer: Self-pay | Admitting: Pulmonary Disease

## 2022-10-29 NOTE — Telephone Encounter (Signed)
PCP sent referral on patient's behalf. He is requesting to see Dr Delton Coombes instead of Dr Francine Graven. Please advise on switch and when patient should follow-up. Dr Francine Graven wanted to followup in June of 2025.

## 2022-11-02 NOTE — Telephone Encounter (Signed)
Okay with me only if Dr. Francine Graven approves

## 2022-11-03 NOTE — Telephone Encounter (Signed)
Ok with me.   Thanks, JD

## 2022-11-05 ENCOUNTER — Other Ambulatory Visit: Payer: Medicare HMO

## 2022-11-05 ENCOUNTER — Telehealth: Payer: Self-pay | Admitting: Gastroenterology

## 2022-11-05 DIAGNOSIS — R600 Localized edema: Secondary | ICD-10-CM

## 2022-11-05 DIAGNOSIS — I671 Cerebral aneurysm, nonruptured: Secondary | ICD-10-CM

## 2022-11-05 NOTE — Telephone Encounter (Signed)
Spoke with pts sister and she states he is very constipated. He is passing hard balls of stool and passing gas. Discussed with her he could try a fleets enema and then do a miralix purge. She verbalized understanding.

## 2022-11-05 NOTE — Telephone Encounter (Signed)
Inbound call from patient's sister stating patient has been severely constipated. Sister stated patient vomited a few days ago and it looked as though he vomited stool. Patient's sister is requesting a call back to be advised of next steps. Please advise,thank you.

## 2022-11-06 ENCOUNTER — Other Ambulatory Visit: Payer: Self-pay | Admitting: Neurosurgery

## 2022-11-06 LAB — BMP8+EGFR
BUN/Creatinine Ratio: 11 (ref 10–24)
BUN: 11 mg/dL (ref 8–27)
CO2: 25 mmol/L (ref 20–29)
Calcium: 9.6 mg/dL (ref 8.6–10.2)
Chloride: 100 mmol/L (ref 96–106)
Creatinine, Ser: 1.03 mg/dL (ref 0.76–1.27)
Glucose: 128 mg/dL — ABNORMAL HIGH (ref 70–99)
Potassium: 4.2 mmol/L (ref 3.5–5.2)
Sodium: 143 mmol/L (ref 134–144)
eGFR: 81 mL/min/{1.73_m2} (ref >59)

## 2022-11-10 ENCOUNTER — Encounter (HOSPITAL_COMMUNITY): Admission: RE | Payer: Self-pay | Source: Home / Self Care

## 2022-11-10 ENCOUNTER — Encounter (HOSPITAL_COMMUNITY): Payer: Self-pay

## 2022-11-10 ENCOUNTER — Other Ambulatory Visit (HOSPITAL_COMMUNITY): Payer: Medicare HMO

## 2022-11-10 ENCOUNTER — Inpatient Hospital Stay (HOSPITAL_COMMUNITY): Admission: RE | Admit: 2022-11-10 | Payer: Medicare HMO | Source: Home / Self Care | Admitting: Neurosurgery

## 2022-11-10 SURGERY — IR WITH ANESTHESIA
Anesthesia: General

## 2022-11-17 ENCOUNTER — Encounter: Payer: Self-pay | Admitting: Family Medicine

## 2022-11-17 ENCOUNTER — Ambulatory Visit (INDEPENDENT_AMBULATORY_CARE_PROVIDER_SITE_OTHER): Payer: Medicare HMO | Admitting: Family Medicine

## 2022-11-17 ENCOUNTER — Ambulatory Visit (INDEPENDENT_AMBULATORY_CARE_PROVIDER_SITE_OTHER): Payer: Medicare HMO

## 2022-11-17 VITALS — BP 124/66 | HR 84 | Temp 97.5°F | Ht 66.0 in | Wt 128.6 lb

## 2022-11-17 DIAGNOSIS — R0602 Shortness of breath: Secondary | ICD-10-CM

## 2022-11-17 DIAGNOSIS — S20219A Contusion of unspecified front wall of thorax, initial encounter: Secondary | ICD-10-CM

## 2022-11-17 DIAGNOSIS — Z23 Encounter for immunization: Secondary | ICD-10-CM | POA: Diagnosis not present

## 2022-11-17 DIAGNOSIS — M4124 Other idiopathic scoliosis, thoracic region: Secondary | ICD-10-CM
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
## 2022-11-18 ENCOUNTER — Ambulatory Visit (HOSPITAL_COMMUNITY)
Admission: RE | Admit: 2022-11-18 | Discharge: 2022-11-18 | Disposition: A | Discharge status: Home / Self Care | Payer: Medicare HMO | Source: Ambulatory Visit | Attending: Family Medicine | Admitting: Family Medicine

## 2022-11-18 DIAGNOSIS — J439 Emphysema, unspecified: Secondary | ICD-10-CM | POA: Insufficient documentation

## 2022-11-18 DIAGNOSIS — S2220XA Unspecified fracture of sternum, initial encounter for closed fracture: Secondary | ICD-10-CM | POA: Diagnosis not present

## 2022-11-18 DIAGNOSIS — I700 Atherosclerosis of aorta: Secondary | ICD-10-CM | POA: Diagnosis not present

## 2022-11-18 DIAGNOSIS — X58XXXA Exposure to other specified factors, initial encounter: Secondary | ICD-10-CM | POA: Insufficient documentation

## 2022-11-18 DIAGNOSIS — S20219A Contusion of unspecified front wall of thorax, initial encounter: Secondary | ICD-10-CM | POA: Insufficient documentation

## 2022-11-18 DIAGNOSIS — I2510 Atherosclerotic heart disease of native coronary artery without angina pectoris: Secondary | ICD-10-CM | POA: Insufficient documentation

## 2022-11-25 ENCOUNTER — Telehealth: Payer: Self-pay

## 2022-11-25 NOTE — Telephone Encounter (Signed)
[facility] Pre-operative Risk Assessment     Request for surgical clearance:     Endoscopy Procedure  What type of surgery is being performed?     EGD at [facility]  When is this surgery scheduled?     TBD  What type of clearance is required ?   Cardiac clearance   Are there any medications that need to be held prior to surgery and how long? No medications need to be held   Practice name and name of physician performing surgery?      [facility]  What is your office phone and fax number?      Phone- [phone]  Fax- [phone]  Anesthesia type (None, local, MAC, general) ?       MAC

## 2022-11-25 NOTE — Telephone Encounter (Signed)
   Name: Jeffrey Mendez  DOB: 1955/09/22  MRN: [id number]  Primary Cardiologist: Reatha Harps, MD  Chart reviewed as part of pre-operative protocol coverage. Because of Jeffrey Mendez's past medical history and time since last visit, he will require a follow-up in-office visit in order to better assess preoperative cardiovascular risk.  Pt was recently started on Lasix per PCP in the setting of lower extremity edema.  Pre-op covering staff: - Please schedule appointment and call patient to inform them. If patient already had an upcoming appointment within acceptable timeframe, please add "pre-op clearance" to the appointment notes so provider is aware. - Please contact requesting surgeon's office via preferred method (i.e, phone, fax) to inform them of need for appointment prior to surgery.    Joylene Grapes, NP  11/25/2022, 12:52 PM

## 2022-11-26 NOTE — Telephone Encounter (Signed)
I s/w the pt to schedule in office appt for pre op clearance. Pt tells me that he broke his back and his sternum about 1 1/2 weeks ago. Pt states he is also needing surgery for an aneurysm in his brain and that surgeon told him that he needs to heal before he can have surgery.   I assured the pt that I will update the GI office of our conversation. Pt said thank you and that he will not be having any procedures right now until he is healed up.

## 2022-11-30 NOTE — Telephone Encounter (Signed)
Dr. Tomasa Rand, the surgery clearance request came to our office as TBD. Did not look to have been scheduled yet.

## 2022-12-01 IMAGING — CT CT CARDIAC CORONARY ARTERY CALCIUM SCORE
3 series · 14 of 20 positions shown, 15 images · non-contrast
Comparison: None.
COMPARISON: None.

Addendum:
EXAM:
OVER-READ INTERPRETATION  CT CHEST

The following report is an over-read performed by radiologist Dr.
over-read does not include interpretation of cardiac or coronary
anatomy or pathology. The calcium score interpretation by the
cardiologist is attached.
CLINICAL DATA: Risk stratification: 64 Year-old White Male
Coronary Calcium Score
TECHNIQUE: The patient was scanned on a Siemens Force scanner. Axial
non-contrast 3 mm slices were carried out through the heart. The
data set was analyzed on a dedicated work station and scored using
the Agatson method.

[Series 2: casc 3.0 bv41 2 bestdiast 70 % · axial · 0.46mm/px · z∈[-301,-193]mm · 4 of 60 slices shown, 5 images]
[im 12/60  vessel]
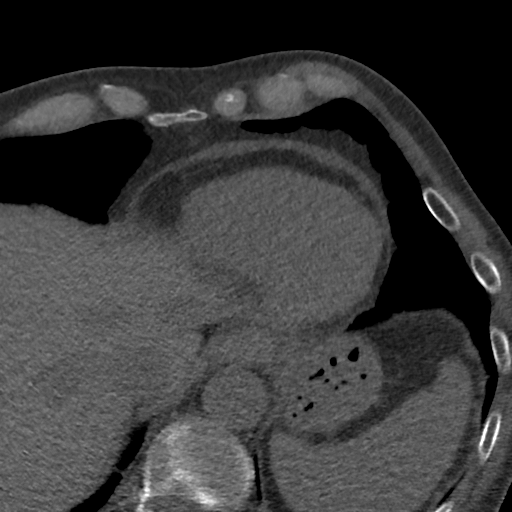
[im 12/60  lung]
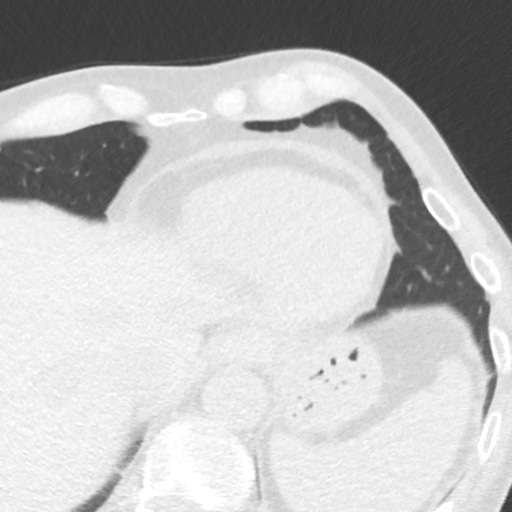
[im 24/60  vessel]
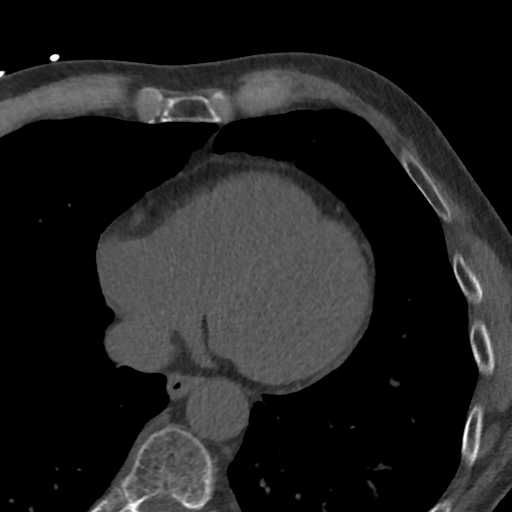
[im 36/60  vessel]
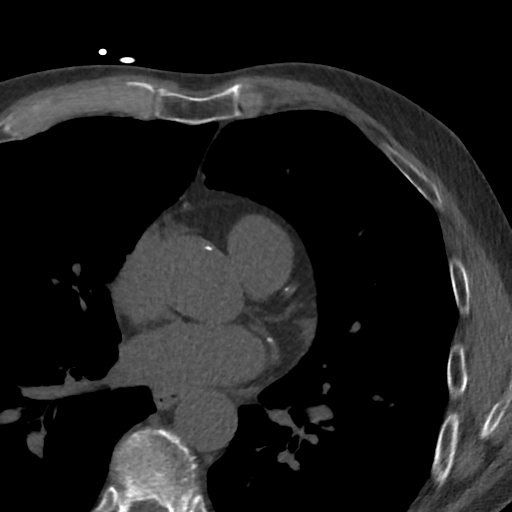
[im 48/60  vessel]
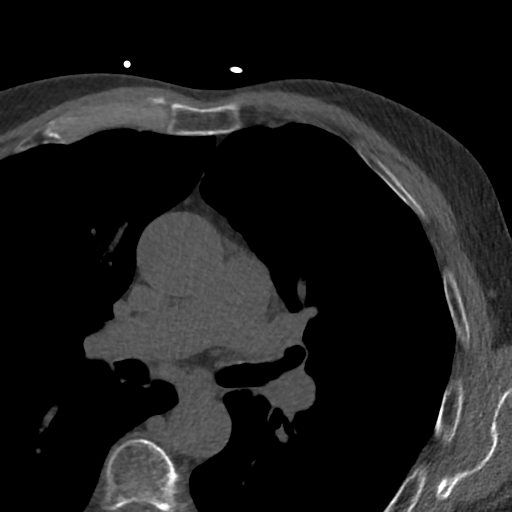

[Series 3: lung 70 % · axial · 0.73mm/px · z∈[-307,-187]mm · 5 of 60 slices shown]
[im 10/60  lung]
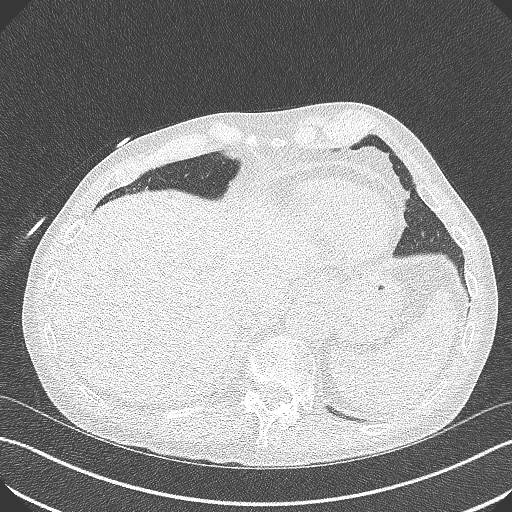
[im 20/60  lung]
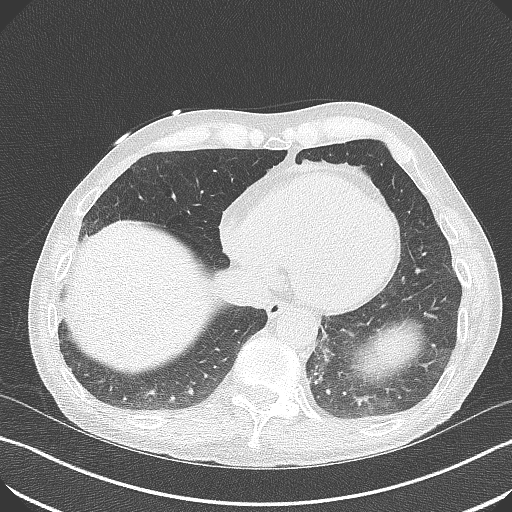
[im 30/60  lung]
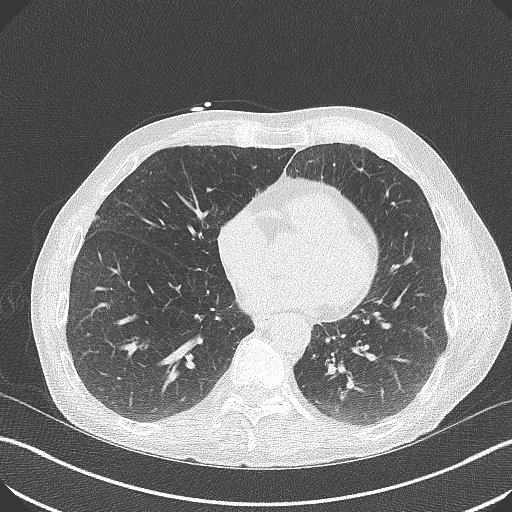
[im 40/60  lung]
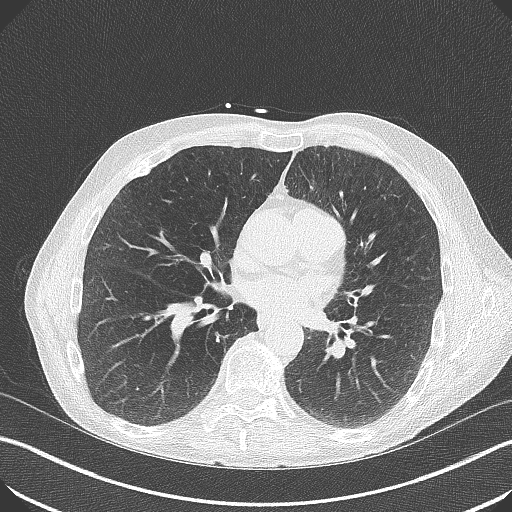
[im 50/60  lung]
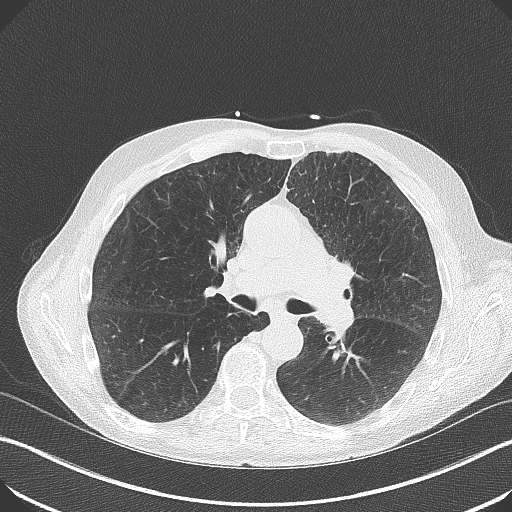

[Series 4: lung st 70 % · axial · 0.73mm/px · z∈[-307,-187]mm · 5 of 60 slices shown]
[im 10/60  lung]
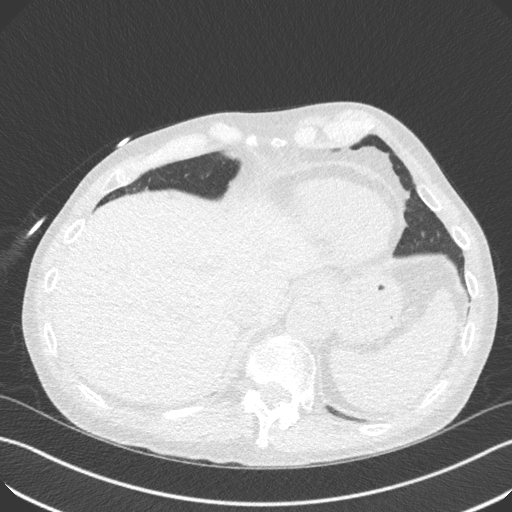
[im 20/60  lung]
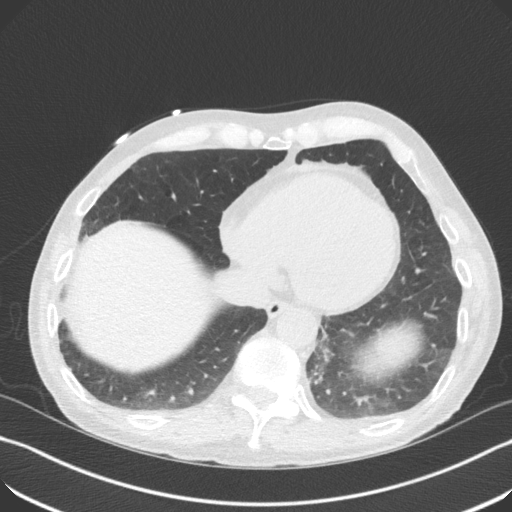
[im 30/60  lung]
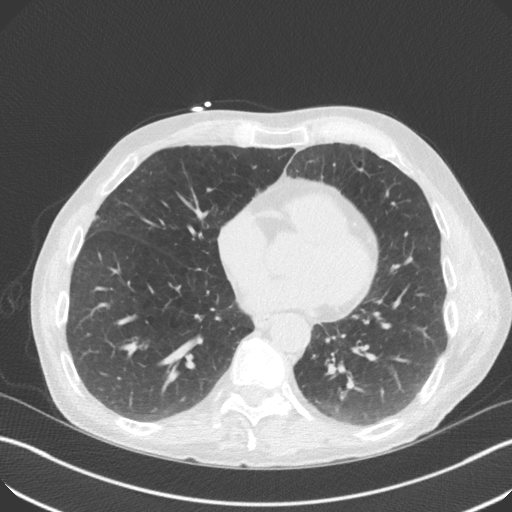
[im 40/60  lung]
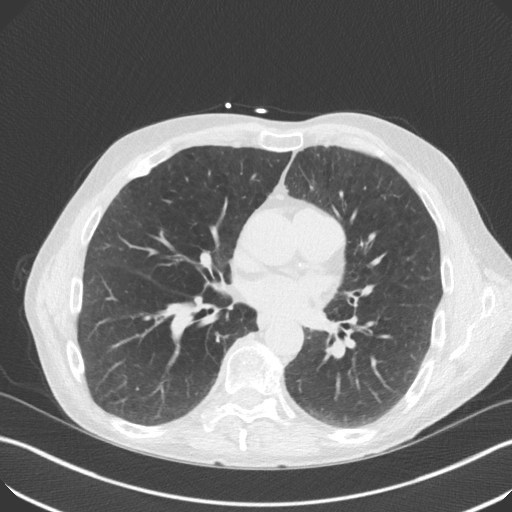
[im 50/60  lung]
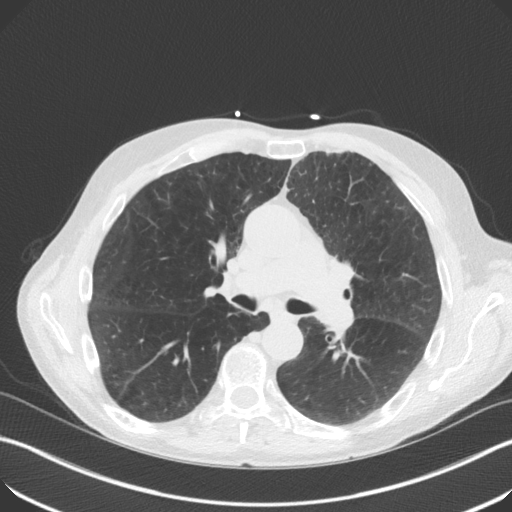

[14 of 20 positions shown; findings below may reference images not displayed]

FINDINGS: Limited view of the lung parenchyma demonstrates centrilobular
emphysema. Airways are normal.

Limited view of the mediastinum demonstrates no adenopathy.
Esophagus normal.

Limited view of the upper abdomen unremarkable.

Limited view of the skeleton demonstrate scoliosis and degenerative
changes of thoracic spine.
IMPRESSION: 1. Mild centrilobular emphysema.
2. No acute findings.
FINDINGS: Non-cardiac: See separate report from [REDACTED].

Ascending Aorta: Evidence of mild ascending aortic dilation 40 mm on
non-contrasted study. Aortic atherosclerosis noted.

Pericardium: Normal.

Coronary arteries: Normal origins.

Coronary Calcium Score:

Left main: 6

Left anterior descending artery: 3

Left circumflex artery: 10

Right coronary artery: 46

Total: 65

Percentile: 53rd for age, sex, and race matched control.
IMPRESSION: 1. Coronary calcium score of 65. This was 53rd percentile for age,
gender, and race matched controls.

2. Evidence of mild ascending aortic dilation on non-contrasted
study. Specificity decreased in non-contrasted study. Consider
secondary imaging modality (echocardiogram, CTA Aorta Protocol, MRA
Aorta Protocol) if clinically indicated.

3.  Aortic atherosclerosis noted.

RECOMMENDATIONS:



If CAC = 0, it is reasonable to withhold statin therapy and reassess
in 5 to 10 years, as long as higher risk conditions are absent
(diabetes mellitus, family history of premature CHD in first degree
relatives (males <55 years; females <65 years), cigarette smoking,
LDL >=190 mg/dL or other independent risk factors).

If CAC is 1 to 99, it is reasonable to initiate statin therapy for
patients ?55 years of age.

If CAC is >=100 or >=75th percentile, it is reasonable to initiate
statin therapy at any age.

Cardiology referral should be considered for patients with CAC
scores =400 or >=75th percentile.

*0440 AHA/ACC/AACVPR/AAPA/ABC/ST FLEUR/DEBOER/EIMERT/Forell/AUBREE/YESI/MERIDIEN
Guideline on the Management of Blood Cholesterol: A Report of the
American College of Cardiology/American Heart Association Task Force
on Clinical Practice Guidelines. J Am Coll Cardiol.
6874;73(24):0553-0693.

*** End of Addendum ***
EXAM:
OVER-READ INTERPRETATION  CT CHEST

The following report is an over-read performed by radiologist Dr.
over-read does not include interpretation of cardiac or coronary
anatomy or pathology. The calcium score interpretation by the
cardiologist is attached.
FINDINGS: Limited view of the lung parenchyma demonstrates centrilobular
emphysema. Airways are normal.

Limited view of the mediastinum demonstrates no adenopathy.
Esophagus normal.

Limited view of the upper abdomen unremarkable.

Limited view of the skeleton demonstrate scoliosis and degenerative
changes of thoracic spine.
IMPRESSION: 1. Mild centrilobular emphysema.
2. No acute findings.

## 2023-01-05 ENCOUNTER — Other Ambulatory Visit: Payer: Self-pay | Admitting: *Deleted

## 2023-01-05 ENCOUNTER — Telehealth: Payer: Self-pay | Admitting: Family Medicine

## 2023-01-05 ENCOUNTER — Ambulatory Visit: Payer: Self-pay | Admitting: Family Medicine

## 2023-01-05 NOTE — Telephone Encounter (Signed)
Copied from CRM [id number]. Topic: Clinical - Red Word Triage >> Jan 05, 2023  3:18 PM Tiffany H wrote: Red Word that prompted transfer to Nurse Triage: Patient called advising that he's having a strange reaction to Buspar. Transferred to RN Triage [place].   Chief Complaint: Reaction to Buspar Symptoms: Nervousness, sweaty, and dilated pupils Frequency: started today Pertinent Negatives: Patient denies accidental overdose Disposition: [] ED /[] Urgent Care (no appt availability in office) / [] Appointment(In office/virtual)/ []  [facility]/ [] Home Care/ [] Refused Recommended Disposition /[] [facility]/ [x]  Follow-up with PCP Additional Notes: Pt reports possible reaction to Buspar after taking morning dose. Reports symptoms of withdrawal with second dose time approaching. Patient wants to know if he should skip a dose or cut his pill in half.     Reason for Disposition  [1] Caller has URGENT medicine question about med that PCP or specialist prescribed AND [2] triager unable to answer question  Answer Assessment - Initial Assessment Questions 1. NAME of MEDICINE: "What medicine(s) are you calling about?"     Buspar 5mg , twice daily  2. QUESTION: "What is your question?" (e.g., double dose of medicine, side effect)     Patient wants to know if he can split the dose in half.  3. PRESCRIBER: "Who prescribed the medicine?" Reason: if prescribed by specialist, call should be referred to that group.     Harlow Mares, FNP  4. SYMPTOMS: "Do you have any symptoms?" If Yes, ask: "What symptoms are you having?"  "How bad are the symptoms (e.g., mild, moderate, severe)     Started having sweatiness, nervousness, and dilated pupils after first dose at 9am, reports withdrawal symptoms when not taking the medication  5. PREGNANCY:  "Is there any chance that you are pregnant?" "When was your last menstrual period?"     No  Protocols used: Medication Question Call-A-AH

## 2023-01-05 NOTE — Telephone Encounter (Signed)
Called patient no answer,no voicemail.

## 2023-01-05 NOTE — Telephone Encounter (Signed)
Per Dr. Nadine Counts instruction. Recommended to patient that he take a 1/2 tablet twice a day for two days and then 1 tablet a day for two. days and stop. Make an appointment with Tiffany and go over side effects, possible interactions and alternative treatments.

## 2023-01-05 NOTE — Telephone Encounter (Signed)
Pt called stating that he has been having a reaction to Buspar Rx since 9:00 AM this morning. Pt reports feeling dizzy and shaking. Says this is not the first time this has happened to him when taking the medication but says it doesn't always happen. Pt says he takes 2 tablets per day and usually takes the tablets either 6 hrs apart or 8 hours apart.   Tried sending a message to Triage to see if anyone could take call but no response. Please call patient about this ASAP.

## 2023-01-08 NOTE — Telephone Encounter (Signed)
Patient is calling in  regards to him needing a new medication called in that has not been called in yet he would like a callback regarding this and the medication called in

## 2023-01-08 NOTE — Telephone Encounter (Signed)
Patient is aware no new medication will be called in that he needed an appointment. Patient given appointment and voiced understanding.

## 2023-01-11 ENCOUNTER — Ambulatory Visit (INDEPENDENT_AMBULATORY_CARE_PROVIDER_SITE_OTHER): Payer: Medicare HMO

## 2023-01-11 ENCOUNTER — Ambulatory Visit (INDEPENDENT_AMBULATORY_CARE_PROVIDER_SITE_OTHER): Payer: Medicare HMO | Admitting: Family Medicine

## 2023-01-11 ENCOUNTER — Encounter: Payer: Self-pay | Admitting: Family Medicine

## 2023-01-11 ENCOUNTER — Other Ambulatory Visit: Payer: Self-pay | Admitting: Family Medicine

## 2023-01-11 VITALS — BP 120/70 | HR 69 | Temp 98.3°F | Ht 66.0 in | Wt 133.1 lb

## 2023-01-11 DIAGNOSIS — I671 Cerebral aneurysm, nonruptured: Secondary | ICD-10-CM | POA: Diagnosis not present

## 2023-01-11 DIAGNOSIS — F419 Anxiety disorder, unspecified: Secondary | ICD-10-CM

## 2023-01-11 DIAGNOSIS — R0602 Shortness of breath: Secondary | ICD-10-CM | POA: Diagnosis not present

## 2023-01-11 DIAGNOSIS — S2222XA Fracture of body of sternum, initial encounter for closed fracture: Secondary | ICD-10-CM | POA: Insufficient documentation

## 2023-01-11 DIAGNOSIS — S2222XS Fracture of body of sternum, sequela: Secondary | ICD-10-CM | POA: Diagnosis not present

## 2023-01-11 DIAGNOSIS — S2222XD Fracture of body of sternum, subsequent encounter for fracture with routine healing: Secondary | ICD-10-CM | POA: Diagnosis not present

## 2023-01-11 MED ORDER — BUSPIRONE HCL 5 MG PO TABS
2.5000 mg | ORAL_TABLET | Freq: Three times a day (TID) | ORAL | 1 refills | Status: DC
Start: 2023-01-11 — End: 2023-04-12
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
## 2023-01-25 ENCOUNTER — Ambulatory Visit (HOSPITAL_COMMUNITY)
Admission: RE | Admit: 2023-01-25 | Discharge: 2023-01-25 | Disposition: A | Discharge status: Home / Self Care | Payer: Medicare HMO | Source: Ambulatory Visit | Attending: Family Medicine | Admitting: Family Medicine

## 2023-01-25 DIAGNOSIS — S2222XS Fracture of body of sternum, sequela: Secondary | ICD-10-CM | POA: Insufficient documentation

## 2023-01-25 DIAGNOSIS — R0602 Shortness of breath: Secondary | ICD-10-CM | POA: Insufficient documentation

## 2023-02-03 ENCOUNTER — Telehealth: Payer: Self-pay

## 2023-02-03 NOTE — Telephone Encounter (Signed)
Copied from CRM [id number]. Topic: Clinical - Lab/Test Results >> Feb 03, 2023  9:49 AM Prudencio Pair wrote: Reason for CRM: Velna Hatchet, patient's sister, called stating that patient would like for the nurse/provider to give him a call with the results from his CT scan. Pt's phone number is [phone].

## 2023-02-03 NOTE — Telephone Encounter (Signed)
Informed pt we do not have CT results back yet because they are behind on reading reports but once we get results we will call him. Pt verbalized understanding

## 2023-02-08 ENCOUNTER — Other Ambulatory Visit: Payer: Self-pay | Admitting: Family Medicine

## 2023-02-08 DIAGNOSIS — R918 Other nonspecific abnormal finding of lung field: Secondary | ICD-10-CM

## 2023-02-08 MED ORDER — DOXYCYCLINE HYCLATE 100 MG PO TABS
100.0000 mg | ORAL_TABLET | Freq: Two times a day (BID) | ORAL | 0 refills | Status: AC
Start: 2023-02-08 — End: 2023-02-15

## 2023-02-08 MED ORDER — PREDNISONE 20 MG PO TABS
40.0000 mg | ORAL_TABLET | Freq: Every day | ORAL | 0 refills | Status: AC
Start: 2023-02-08 — End: 2023-02-13

## 2023-02-12 ENCOUNTER — Ambulatory Visit (HOSPITAL_COMMUNITY): Payer: Medicare HMO

## 2023-02-18 ENCOUNTER — Telehealth: Payer: Self-pay | Admitting: Family Medicine

## 2023-02-18 NOTE — Telephone Encounter (Signed)
Called patient to get more information.

## 2023-02-18 NOTE — Telephone Encounter (Signed)
 Copied from CRM [id number]. Topic: Clinical - Prescription Issue >> Feb 16, 2023  1:40 PM Carlatta H wrote: Reason for CRM: Prednison is 20mg  and Doxycycline100mg  is too strong for the patient//Please call about prescription

## 2023-02-22 ENCOUNTER — Telehealth: Payer: Self-pay

## 2023-02-22 MED ORDER — ONDANSETRON HCL 4 MG PO TABS: 4.0000 mg | ORAL_TABLET | Freq: Three times a day (TID) | ORAL | 0 refills | Status: DC | PRN

## 2023-02-22 NOTE — Addendum Note (Signed)
 Addended by: Hessie Diener on: 02/22/2023 03:36 PM   Modules accepted: Orders

## 2023-02-22 NOTE — Telephone Encounter (Signed)
 Doxycyline was sent in 2 weeks ago. How many doses has he taken? Please make sure he is taking this with food. Ok to send zofran if he is having nausea as a side effect.

## 2023-02-22 NOTE — Telephone Encounter (Signed)
 Copied from CRM [id number]. Topic: Clinical - Medical Advice >> Feb 22, 2023  2:24 PM Mosetta Putt H wrote: Reason for CRM: PT sister needs a call back in regards to doxycycline is making him sick and needs a different presciption

## 2023-02-22 NOTE — Telephone Encounter (Signed)
 Pt's sister states he hasn't been taking the doxy or prednisone because it made him feel sick so rx for zofran sent in per Tiffany and advised to take meds with food and pt's sister voiced understanding.

## 2023-03-08 ENCOUNTER — Ambulatory Visit (HOSPITAL_COMMUNITY)
Admission: RE | Admit: 2023-03-08 | Discharge: 2023-03-08 | Disposition: A | Discharge status: Home / Self Care | Payer: Medicare HMO | Source: Ambulatory Visit | Attending: Family Medicine | Admitting: Family Medicine

## 2023-03-08 DIAGNOSIS — R918 Other nonspecific abnormal finding of lung field: Secondary | ICD-10-CM | POA: Diagnosis present

## 2023-03-17 ENCOUNTER — Ambulatory Visit: Payer: Self-pay | Admitting: Family Medicine

## 2023-03-17 NOTE — Telephone Encounter (Signed)
Copied from CRM [id number]. Topic: Clinical - Red Word Triage >> Mar 17, 2023  2:56 PM Monisha R wrote: [facility] that prompted transfer to Nurse Triage:  Patient is weak and shortness of breather Has been having this since getting CT scans Every time go in the air or try to move can't seem to catch breathe   Chief Complaint: Dyspnea on Exertion  Symptoms: Dyspnea on Exertion, Cough,  Frequency: Acute Pertinent Negatives: Patient denies fever Disposition: [x] ED /[] Urgent Care (no appt availability in office) / [] Appointment(In office/virtual)/ []  [facility]/ [] Home Care/ [] Refused Recommended Disposition /[] [facility]/ []  Follow-up with PCP Additional Notes: Jeffrey Mendez is a 68 year old male being triaged today for dyspnea with exertion. The patient has a history of COPD and Emphysema. The symptoms are acute, along with a report of chest pain that is mild and lateral in nature. Reports having Utilized his nebulizer last night with no improvement in symptoms. Reports a productive cough as well, with yellow sputum production. Denies a fever at this time. No audible stridor or wheezing during triage. Advised patient to go to the nearest ER immediately for suspected exacerbation. Patient stated he would try his nebulizer first and then go to the ER. Provided education on urgency and need for evaluation of symptoms. Patient verbalized understanding and chooses to stick with planned home care and ER if needed.    Reason for Disposition  [1] MODERATE difficulty breathing (e.g., speaks in phrases, SOB even at rest, pulse 100-120) AND [2] NEW-onset or WORSE than normal  Answer Assessment - Initial Assessment Questions 1. RESPIRATORY STATUS: "Describe your breathing?" (e.g., wheezing, shortness of breath, unable to speak, severe coughing)      Moderate Dyspnea on Exertion  2. ONSET: "When did this breathing problem begin?"      Chronic  3. PATTERN "Does the difficult breathing come and  go, or has it been constant since it started?"      Comes and Goes, Worsening  4. SEVERITY: "How bad is your breathing?" (e.g., mild, moderate, severe)    - MILD: No SOB at rest, mild SOB with walking, speaks normally in sentences, can lie down, no retractions, pulse < 100.    - MODERATE: SOB at rest, SOB with minimal exertion and prefers to sit, cannot lie down flat, speaks in phrases, mild retractions, audible wheezing, pulse 100-120.    - SEVERE: Very SOB at rest, speaks in single words, struggling to breathe, sitting hunched forward, retractions, pulse > 120      Moderate   5. RECURRENT SYMPTOM: "Have you had difficulty breathing before?" If Yes, ask: "When was the last time?" and "What happened that time?"      Chronic issue  6. CARDIAC HISTORY: "Do you have any history of heart disease?" (e.g., heart attack, angina, bypass surgery, angioplasty)      No  7. LUNG HISTORY: "Do you have any history of lung disease?"  (e.g., pulmonary embolus, asthma, emphysema)     COPD, Emphysema  8. CAUSE: "What do you think is causing the breathing problem?"      Unsure  9. OTHER SYMPTOMS: "Do you have any other symptoms? (e.g., dizziness, runny nose, cough, chest pain, fever)     Cough-Productive, Chest Pain  10. O2 SATURATION MONITOR:  "Do you use an oxygen saturation monitor (pulse oximeter) at home?" If Yes, ask: "What is your reading (oxygen level) today?" "What is your usual oxygen saturation reading?" (e.g., 95%)  96%  12. TRAVEL: "Have you traveled out of the country in the last month?" (e.g., travel history, exposures)       No  Protocols used: Breathing Difficulty-A-AH

## 2023-04-12 ENCOUNTER — Other Ambulatory Visit: Payer: Self-pay | Admitting: Family Medicine

## 2023-04-12 DIAGNOSIS — F419 Anxiety disorder, unspecified: Secondary | ICD-10-CM

## 2023-04-12 MED ORDER — BUSPIRONE HCL 5 MG PO TABS
2.5000 mg | ORAL_TABLET | Freq: Three times a day (TID) | ORAL | 1 refills | Status: DC
Start: 2023-04-12 — End: 2023-11-29

## 2023-04-12 NOTE — Telephone Encounter (Signed)
 Copied from CRM [id number]. Topic: Clinical - Medication Refill >> Apr 12, 2023 12:03 PM Everette C wrote: Most Recent Primary Care Visit:  Provider: Gabriel Earing  Department: [facility]  Visit Type: ACUTE  Date: 01/11/2023  Medication: busPIRone (BUSPAR) 5 MG tablet [[id number]]  Has the patient contacted their pharmacy? Yes (Agent: If no, request that the patient contact the pharmacy for the refill. If patient does not wish to contact the pharmacy document the reason why and proceed with request.) (Agent: If yes, when and what did the pharmacy advise?)  Is this the correct pharmacy for this prescription? Yes If no, delete pharmacy and type the correct one.  This is the patient's preferred pharmacy:  [facility] [place], [place] - [address] [place] [address] Phone: [phone] Fax: [phone]   Has the prescription been filled recently? Yes  Is the patient out of the medication? No  Has the patient been seen for an appointment in the last year OR does the patient have an upcoming appointment? Yes  Can we respond through MyChart? No  Agent: Please be advised that Rx refills may take up to 3 business days. We ask that you follow-up with your pharmacy.
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
## 2023-05-03 ENCOUNTER — Encounter: Payer: Self-pay | Admitting: Internal Medicine

## 2023-05-03 ENCOUNTER — Ambulatory Visit: Payer: Medicare HMO | Admitting: Internal Medicine

## 2023-05-03 ENCOUNTER — Telehealth: Payer: Self-pay

## 2023-05-03 VITALS — BP 132/70 | HR 60 | Ht 66.0 in | Wt 129.0 lb

## 2023-05-03 DIAGNOSIS — J449 Chronic obstructive pulmonary disease, unspecified: Secondary | ICD-10-CM

## 2023-05-03 MED ORDER — BUDESONIDE-FORMOTEROL FUMARATE 80-4.5 MCG/ACT IN AERO: INHALATION_SPRAY | RESPIRATORY_TRACT | 12 refills | Status: DC

## 2023-05-03 NOTE — Assessment & Plan Note (Signed)
 2PPD smoker X 44 yrs.> quit 04/2022 but worse since fx sternum 11/17/22 - Echo   09/11/22 ok x mild RV dysfunction , Nl RA - PFT's  04/09/2013  FEV1 2.52 (80 % ) ratio 0.61  and FV curve minimally concave  = GOLD 1  - 05/03/2023 mild pseudowheeze with FVC and very dry mouth on breztri so rec try symbicort 80 2bid and f/u with all meds in [facility] office   Symptoms are markedly disproportionate to objective findings and not clear to what extent this is actually a pulmonary  problem but pt does appear to have difficult to sort out respiratory symptoms of unknown origin for which  DDX  = almost all start with A and  include Adherence, Ace Inhibitors, Acid Reflux, Active Sinus Disease, Alpha 1 Antitripsin deficiency, Anxiety masquerading as Airways dz,  ABPA,  Allergy(esp in young), Aspiration (esp in elderly), Adverse effects of meds,  Active smoking or Vaping, A bunch of PE's/clot burden (a few small clots can't cause this syndrome unless there is already severe underlying pulm or vascular dz with poor reserve),  Anemia or thyroid disorder, plus two Bs  = Bronchiectasis and Beta blocker use..and one C= CHF    Adherence is always the initial "prime suspect" and is a multilayered concern that requires a "trust but verify" approach in every patient - starting with knowing how to use medications, especially inhalers, correctly, keeping up with refills and understanding the fundamental difference between maintenance and prns vs those medications only taken for a very short course and then stopped and not refilled.  - - The proper method of use, as well as anticipated side effects, of a metered-dose inhaler were discussed and demonstrated to the patient using teach back method.  - return to [facility] clinic with all meds in hand using a trust but verify approach to confirm accurate Medication  Reconciliation The principal here is that until we are certain that the  patients are doing what we've asked, it makes no sense to ask them  to do more.   ? Adverse drug effects > since mouth very dry may be due to breztri or sinus dz > try off breztri 1st as COPD not all that severe clinically  ? Anxiety/depression/ deconditionng  > usually at the bottom of this list of usual suspects but should be much higher on this pt's based on H and P and note already on psychotropics and may interfere with adherence and also interpretation of response or lack thereof to symptom management which can be quite subjective.   ? Alpha on / allergy/ABPA > check labs next ov          Each maintenance medication was reviewed in detail including emphasizing most importantly the difference between maintenance and prns and under what circumstances the prns are to be triggered using an action plan format where appropriate.  Total time for H and P, chart review, counseling, reviewing hfa device(s) and generating customized AVS unique to this office visit / same day charting = 45 min for    refractory respiratory  symptoms of uncertain etiology
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
## 2023-05-03 NOTE — Patient Instructions (Addendum)
 Remember purse lip breathing   Try off breztri  and on symbicort 80  Take 2 puffs first thing in am and then another 2 puffs about 12 hours later.    Zostrix cream is a good option for your chest 4 x daily   Wear your oxygen when you walk to compare to how you feel  off it - goal is well above 90%    .Please schedule a follow up office visit in 6 weeks -[place] OFFICE , call sooner if needed with all medications /inhalers/ solutions in hand so we can verify exactly what you are taking. This includes all medications from all doctors and over the counters
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
## 2023-06-14 ENCOUNTER — Encounter: Payer: Self-pay | Admitting: Internal Medicine

## 2023-06-14 ENCOUNTER — Ambulatory Visit: Admitting: Internal Medicine

## 2023-06-14 VITALS — BP 126/65 | HR 55 | Ht 66.0 in | Wt 127.0 lb

## 2023-06-14 DIAGNOSIS — Z87891 Personal history of nicotine dependence: Secondary | ICD-10-CM | POA: Diagnosis not present

## 2023-06-14 DIAGNOSIS — J449 Chronic obstructive pulmonary disease, unspecified: Secondary | ICD-10-CM

## 2023-06-14 NOTE — Patient Instructions (Addendum)
 Ok to try breztri one puff twice daily or 2 puffs each am only (no pm dose) -  if not happy then go back to Symbicort  but do each a week at a time to give a fair trial.   Please schedule a follow up visit in 6  months but call sooner if needed

## 2023-06-14 NOTE — Assessment & Plan Note (Signed)
 Quit 2024 so eligible for [facility]  until 2039    - Chest CT w/o contrast 03/08/23 improved > f/u one year rec    >>> f/u per PCP unless otherwise requested thru this office/ pt advised       F/u here  6 m, sooner if needed  Each maintenance medication was reviewed in detail including emphasizing most importantly the difference between maintenance and prns and under what circumstances the prns are to be triggered using an action plan format where appropriate.  Total time for H and P, chart review, counseling, reviewing hfa  device(s) and generating customized AVS unique to this office visit / same day charting = 31 min ov

## 2023-06-14 NOTE — Assessment & Plan Note (Signed)
 2PPD smoker X 44 yrs.> quit 04/2022 but worse since fx sternum 11/17/22 - Echo   09/11/22 ok x mild RV dysfunction , Nl RA - PFT's  04/09/2013  FEV1 2.52 (80 % ) ratio 0.61  and FV curve minimally concave  = GOLD 1   - 05/03/2023 mild pseudowheeze with FVC and very dry mouth on breztri so rec try symbicort  80 2bid and f/u with all meds in [facility] office > thinks symbicort  worked better but breztri gets it free as of 06/14/2023   rec breztri one bid or 2 each am perhaps via spacer vs resume symbicort  80/ ok to hold off pfts due to brain aneurysm unless needed for preop eval.  Comment: He has more AB and upper airway irritability and some prostate issues so may paradoxically do better on low dose symbicort  if can afford it but I suggested he do one week at a time and consider adding a spacer if not satisfied.

## 2023-06-23 ENCOUNTER — Ambulatory Visit: Admitting: Gastroenterology

## 2023-07-23 ENCOUNTER — Ambulatory Visit (INDEPENDENT_AMBULATORY_CARE_PROVIDER_SITE_OTHER): Payer: Medicare HMO

## 2023-07-23 VITALS — Ht 66.0 in | Wt 126.0 lb

## 2023-07-23 DIAGNOSIS — Z0000 Encounter for general adult medical examination without abnormal findings: Secondary | ICD-10-CM

## 2023-07-23 DIAGNOSIS — Z5320 Procedure and treatment not carried out because of patient's decision for unspecified reasons: Secondary | ICD-10-CM | POA: Diagnosis not present

## 2023-07-23 DIAGNOSIS — Z7189 Other specified counseling: Secondary | ICD-10-CM
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
## 2023-07-23 NOTE — Patient Instructions (Signed)
 Jeffrey Mendez , Thank you for taking time out of your busy schedule to complete your Annual Wellness Visit with me. I enjoyed our conversation and look forward to speaking with you again next year. I, as well as your care team,  appreciate your ongoing commitment to your health goals. Please review the following plan we discussed and let me know if I can assist you in the future.  Your Game plan/ To Do List     Referrals: If you haven't heard from the office you've been referred to, please reach out to them at the phone number provided.   I will mail you a list of the vaccines we discussed during your visit today  Follow up Visits:  Next Medicare AWV with our clinical staff:   July 25, 2024 at 2:30 pm telephone  Have you seen your provider in the last 6 months (3 months if uncontrolled diabetes)? no  Next Office Visit with your provider: July 30, 2023 at 2:45 pm  Clinician Recommendations:    Aim for 30 minutes of exercise or brisk walking, 6-8 glasses of water, and 5 servings of fruits and vegetables each day.   I enjoyed our conversation today and look forward to talking with you again next year!! Have a wonderful and safe year. All the best, Jeffrey Mendez      This is a list of the screening recommended for you and due dates:  Health Maintenance  Topic Date Due   DTaP/Tdap/Td vaccine (1 - Tdap) Never done   Pneumonia Vaccine (1 of 2 - PCV) 02/14/1976   Zoster (Shingles) Vaccine (1 of 2) Never done   Colon Cancer Screening  08/11/2016   COVID-19 Vaccine (1 - 2024-25 season) 11/07/2023*   Flu Shot  09/17/2023   Screening for Lung Cancer  03/07/2024   Medicare Annual Wellness Visit  07/22/2024   Hepatitis C Screening  Completed   HPV Vaccine  Aged Out   Meningitis B Vaccine  Aged Out  *Topic was postponed. The date shown is not the original due date.    Advanced directives: (Declined) Advance directive discussed with you today. Even though you declined this today, please call our office  should you change your mind, and we can give you the proper paperwork for you to fill out. Advance Care Planning is important because it:  [x]  Makes sure you receive the medical care that is consistent with your values, goals, and preferences  [x]  It provides guidance to your family and loved ones and reduces their decisional burden about whether or not they are making the right decisions based on your wishes.  Follow the link provided in your after visit summary or read over the paperwork we have mailed to you to help you started getting your Advance Directives in place. If you need assistance in completing these, please reach out to us  so that we can help you!  See attachments for Preventive Care and Fall Prevention Tips.   Understanding Your Risk for Falls  Millions of people have serious injuries from falls each year. It is important to understand your risk of falling. Talk with your health care provider about your risk and what you can do to lower it. If you do have a serious fall, make sure to tell your provider. Falling once raises your risk of falling again. How can falls affect me? Serious injuries from falls are common. These include: Broken bones, such as hip fractures. Head injuries, such as traumatic brain injuries (TBI) or  concussions. A fear of falling can cause you to avoid activities and stay at home. This can make your muscles weaker and raise your risk for a fall. What can increase my risk? There are a number of risk factors that increase your risk for falling. The more risk factors you have, the higher your risk of falling. Serious injuries from a fall happen most often to people who are older than 67 years old. Teenagers and young adults ages 27-29 are also at higher risk. Common risk factors include: Weakness in the lower body. Being generally weak or confused due to long-term (chronic) illness. Dizziness or balance problems. Poor vision. Medicines that cause  dizziness or drowsiness. These may include: Medicines for your blood pressure, heart, anxiety, insomnia, or swelling (edema). Pain medicines. Muscle relaxants. Other risk factors include: Drinking alcohol. Having had a fall in the past. Having foot pain or wearing improper footwear. Working at a dangerous job. Having any of the following in your home: Tripping hazards, such as floor clutter or loose rugs. Poor lighting. Pets. Having dementia or memory loss. What actions can I take to lower my risk of falling?     Physical activity Stay physically fit. Do strength and balance exercises. Consider taking a regular class to build strength and balance. Yoga and tai chi are good options. Vision Have your eyes checked every year and your prescription for glasses or contacts updated as needed. Shoes and walking aids Wear non-skid shoes. Wear shoes that have rubber soles and low heels. Do not wear high heels. Do not walk around the house in socks or slippers. Use a cane or walker as told by your provider. Home safety Attach secure railings on both sides of your stairs. Install grab bars for your bathtub, shower, and toilet. Use a non-skid mat in your bathtub or shower. Attach bath mats securely with double-sided, non-slip rug tape. Use good lighting in all rooms. Keep a flashlight near your bed. Make sure there is a clear path from your bed to the bathroom. Use night-lights. Do not use throw rugs. Make sure all carpeting is taped or tacked down securely. Remove all clutter from walkways and stairways, including extension cords. Repair uneven or broken steps and floors. Avoid walking on icy or slippery surfaces. Walk on the grass instead of on icy or slick sidewalks. Use ice melter to get rid of ice on walkways in the winter. Use a cordless phone. Questions to ask your health care provider Can you help me check my risk for a fall? Do any of my medicines make me more likely to  fall? Should I take a vitamin D  supplement? What exercises can I do to improve my strength and balance? Should I make an appointment to have my vision checked? Do I need a bone density test to check for weak bones (osteoporosis)? Would it help to use a cane or a walker? Where to find more information Centers for Disease Control and Prevention, STEADI: TonerPromos.no Community-Based Fall Prevention Programs: TonerPromos.no [employer] on Aging: BaseRingTones.pl Contact a health care provider if: You fall at home. You are afraid of falling at home. You feel weak, drowsy, or dizzy. This information is not intended to replace advice given to you by your health care provider. Make sure you discuss any questions you have with your health care provider. Document Revised: 10/06/2021 Document Reviewed: 10/06/2021 Elsevier Patient Education  2024 Else[employer] Inc.  Managing Pain Without Opioids Opioids are strong medicines used to treat moderate to  severe pain. For some people, especially those who have long-term (chronic) pain, opioids may not be the best choice for pain management due to: Side effects like nausea, constipation, and sleepiness. The risk of addiction (opioid use disorder). The longer you take opioids, the greater your risk of addiction. Pain that lasts for more than 3 months is called chronic pain. Managing chronic pain usually requires more than one approach and is often provided by a team of health care providers working together (multidisciplinary approach). Pain management may be done at a pain management center or pain clinic. How to manage pain without the use of opioids Use non-opioid medicines Non-opioid medicines for pain may include: Over-the-counter or prescription non-steroidal anti-inflammatory drugs (NSAIDs). These may be the first medicines used for pain. They work well for muscle and bone pain, and they reduce swelling. Acetaminophen . This over-the-counter medicine may work well  for milder pain but not swelling. Antidepressants. These may be used to treat chronic pain. A certain type of antidepressant (tricyclics) is often used. These medicines are given in lower doses for pain than when used for depression. Anticonvulsants. These are usually used to treat seizures but may also reduce nerve (neuropathic) pain. Muscle relaxants. These relieve pain caused by sudden muscle tightening (spasms). You may also use a pain medicine that is applied to the skin as a patch, cream, or gel (topical analgesic), such as a numbing medicine. These may cause fewer side effects than medicines taken by mouth. Do certain therapies as directed Some therapies can help with pain management. They include: Physical therapy. You will do exercises to gain strength and flexibility. A physical therapist may teach you exercises to move and stretch parts of your body that are weak, stiff, or painful. You can learn these exercises at physical therapy visits and practice them at home. Physical therapy may also involve: Massage. Heat wraps or applying heat or cold to affected areas. Electrical signals that interrupt pain signals (transcutaneous electrical nerve stimulation, TENS). Weak lasers that reduce pain and swelling (low-level laser therapy). Signals from your body that help you learn to regulate pain (biofeedback). Occupational therapy. This helps you to learn ways to function at home and work with less pain. Recreational therapy. This involves trying new activities or hobbies, such as a physical activity or drawing. Mental health therapy, including: Cognitive behavioral therapy (CBT). This helps you learn coping skills for dealing with pain. Acceptance and commitment therapy (ACT) to change the way you think and react to pain. Relaxation therapies, including muscle relaxation exercises and mindfulness-based stress reduction. Pain management counseling. This may be individual, family, or group  counseling.  Receive medical treatments Medical treatments for pain management include: Nerve block injections. These may include a pain blocker and anti-inflammatory medicines. You may have injections: Near the spine to relieve chronic back or neck pain. Into joints to relieve back or joint pain. Into nerve areas that supply a painful area to relieve body pain. Into muscles (trigger point injections) to relieve some painful muscle conditions. A medical device placed near your spine to help block pain signals and relieve nerve pain or chronic back pain (spinal cord stimulation device). Acupuncture. Follow these instructions at home Medicines Take over-the-counter and prescription medicines only as told by your health care provider. If you are taking pain medicine, ask your health care providers about possible side effects to watch out for. Do not drive or use heavy machinery while taking prescription opioid pain medicine. Lifestyle  Do not use drugs or  alcohol to reduce pain. If you drink alcohol, limit how much you have to: 0-1 drink a day for women who are not pregnant. 0-2 drinks a day for men. Know how much alcohol is in a drink. In the [place] one drink equals one 12 oz bottle of beer (355 mL), one 5 oz glass of wine (148 mL), or one 1 oz glass of hard liquor (44 mL). Do not use any products that contain nicotine or tobacco. These products include cigarettes, chewing tobacco, and vaping devices, such as e-cigarettes. If you need help quitting, ask your health care provider. Eat a healthy diet and maintain a healthy weight. Poor diet and excess weight may make pain worse. Eat foods that are high in fiber. These include fresh fruits and vegetables, whole grains, and beans. Limit foods that are high in fat and processed sugars, such as fried and sweet foods. Exercise regularly. Exercise lowers stress and may help relieve pain. Ask your health care provider what activities and exercises  are safe for you. If your health care provider approves, join an exercise class that combines movement and stress reduction. Examples include yoga and tai chi. Get enough sleep. Lack of sleep may make pain worse. Lower stress as much as possible. Practice stress reduction techniques as told by your therapist. General instructions Work with all your pain management providers to find the treatments that work best for you. You are an important member of your pain management team. There are many things you can do to reduce pain on your own. Consider joining an online or in-person support group for people who have chronic pain. Keep all follow-up visits. This is important. Where to find more information You can find more information about managing pain without opioids from: American Academy of Pain Medicine: painmed.org Institute for Chronic Pain: instituteforchronicpain.org American Chronic Pain Association: theacpa.org Contact a health care provider if: You have side effects from pain medicine. Your pain gets worse or does not get better with treatments or home therapy. You are struggling with anxiety or depression. Summary Many types of pain can be managed without opioids. Chronic pain may respond better to pain management without opioids. Pain is best managed when you and a team of health care providers work together. Pain management without opioids may include non-opioid medicines, medical treatments, physical therapy, mental health therapy, and lifestyle changes. Tell your health care providers if your pain gets worse or is not being managed well enough. This information is not intended to replace advice given to you by your health care provider. Make sure you discuss any questions you have with your health care provider. Document Revised: 05/15/2020 Document Reviewed: 05/15/2020 Elsevier Patient Education  2024 [employer].  Understanding Your Risk for Falls Millions of people have  serious injuries from falls each year. It is important to understand your risk of falling. Talk with your health care provider about your risk and what you can do to lower it. If you do have a serious fall, make sure to tell your provider. Falling once raises your risk of falling again. How can falls affect me? Serious injuries from falls are common. These include: Broken bones, such as hip fractures. Head injuries, such as traumatic brain injuries (TBI) or concussions. A fear of falling can cause you to avoid activities and stay at home. This can make your muscles weaker and raise your risk for a fall. What can increase my risk? There are a number of risk factors that increase your risk  for falling. The more risk factors you have, the higher your risk of falling. Serious injuries from a fall happen most often to people who are older than 67 years old. Teenagers and young adults ages 63-29 are also at higher risk. Common risk factors include: Weakness in the lower body. Being generally weak or confused due to long-term (chronic) illness. Dizziness or balance problems. Poor vision. Medicines that cause dizziness or drowsiness. These may include: Medicines for your blood pressure, heart, anxiety, insomnia, or swelling (edema). Pain medicines. Muscle relaxants. Other risk factors include: Drinking alcohol. Having had a fall in the past. Having foot pain or wearing improper footwear. Working at a dangerous job. Having any of the following in your home: Tripping hazards, such as floor clutter or loose rugs. Poor lighting. Pets. Having dementia or memory loss. What actions can I take to lower my risk of falling?     Physical activity Stay physically fit. Do strength and balance exercises. Consider taking a regular class to build strength and balance. Yoga and tai chi are good options. Vision Have your eyes checked every year and your prescription for glasses or contacts updated as  needed. Shoes and walking aids Wear non-skid shoes. Wear shoes that have rubber soles and low heels. Do not wear high heels. Do not walk around the house in socks or slippers. Use a cane or walker as told by your provider. Home safety Attach secure railings on both sides of your stairs. Install grab bars for your bathtub, shower, and toilet. Use a non-skid mat in your bathtub or shower. Attach bath mats securely with double-sided, non-slip rug tape. Use good lighting in all rooms. Keep a flashlight near your bed. Make sure there is a clear path from your bed to the bathroom. Use night-lights. Do not use throw rugs. Make sure all carpeting is taped or tacked down securely. Remove all clutter from walkways and stairways, including extension cords. Repair uneven or broken steps and floors. Avoid walking on icy or slippery surfaces. Walk on the grass instead of on icy or slick sidewalks. Use ice melter to get rid of ice on walkways in the winter. Use a cordless phone. Questions to ask your health care provider Can you help me check my risk for a fall? Do any of my medicines make me more likely to fall? Should I take a vitamin D  supplement? What exercises can I do to improve my strength and balance? Should I make an appointment to have my vision checked? Do I need a bone density test to check for weak bones (osteoporosis)? Would it help to use a cane or a walker? Where to find more information Centers for Disease Control and Prevention, STEADI: TonerPromos.no Community-Based Fall Prevention Programs: TonerPromos.no [employer] on Aging: BaseRingTones.pl Contact a health care provider if: You fall at home. You are afraid of falling at home. You feel weak, drowsy, or dizzy. This information is not intended to replace advice given to you by your health care provider. Make sure you discuss any questions you have with your health care provider. Document Revised: 10/06/2021 Document Reviewed:  10/06/2021 Elsevier Patient Education  2024 [employer].

## 2023-07-30 ENCOUNTER — Ambulatory Visit: Admitting: Family Medicine

## 2023-08-11 ENCOUNTER — Ambulatory Visit: Admitting: Family Medicine

## 2023-10-04 ENCOUNTER — Other Ambulatory Visit: Payer: Self-pay | Admitting: Family Medicine

## 2023-10-04 DIAGNOSIS — F5104 Psychophysiologic insomnia: Secondary | ICD-10-CM

## 2023-10-07 ENCOUNTER — Ambulatory Visit: Payer: Self-pay | Admitting: Internal Medicine

## 2023-10-07 NOTE — Telephone Encounter (Signed)
 FYI Only or Action Required?: Action required by provider: request for appointment and wants to be seen sooner than October.  Patient is followed in Pulmonology for COPD, last seen on 06/14/2023 by Darlean Ozell NOVAK, MD.  Called Nurse Triage reporting Shortness of Breath.  Symptoms began a week ago.  Interventions attempted: Maintenance inhaler and Increased fluids/rest.  Symptoms are: unchanged.  Triage Disposition: See PCP When Office is Open (Within 3 Days)-needing a follow up call  Patient/caregiver understands and will follow disposition?: No, wishes to speak with PCP Copied from CRM #[id number]. Topic: Clinical - Red Word Triage >> Oct 07, 2023  4:40 PM Celestine FALCON wrote: Red Word that prompted transfer to Nurse Triage: Pt is calling to schedule 6 mo follow up with Dr. Darlean  (projected 12/11/23), but the pt wants to be seen sooner.   Pt stated he is experiencing a flair up of SOB and difficulty breathing especially with the heat lately.   Pt sees Dr. Darlean in [place] no appt made. Reason for Disposition  [1] MODERATE longstanding difficulty breathing (e.g., speaks in phrases, SOB even at rest, pulse 100-120) AND [2] SAME as normal  Answer Assessment - Initial Assessment Questions 1. RESPIRATORY STATUS: Describe your breathing? (e.g., wheezing, shortness of breath, unable to speak, severe coughing)      Shortness of breath, coughing 2. ONSET: When did this breathing problem begin?      Going on all week 3. PATTERN Does the difficult breathing come and go, or has it been constant since it started?      Constant when he is outside-patient is having to remain inside due to weather 4. SEVERITY: How bad is your breathing? (e.g., mild, moderate, severe)      Moderate to severe when he is outside 5. RECURRENT SYMPTOM: Have you had difficulty breathing before? If Yes, ask: When was the last time? and What happened that time?      yes 6. CARDIAC HISTORY: Do you have any  history of heart disease? (e.g., heart attack, angina, bypass surgery, angioplasty)      no 7. LUNG HISTORY: Do you have any history of lung disease?  (e.g., pulmonary embolus, asthma, emphysema)     COPD, emphysema 8. CAUSE: What do you think is causing the breathing problem?      unsure 9. OTHER SYMPTOMS: Do you have any other symptoms? (e.g., chest pain, cough, dizziness, fever, runny nose)     cough 10. O2 SATURATION MONITOR:  Do you use an oxygen saturation monitor (pulse oximeter) at home? If Yes, ask: What is your reading (oxygen level) today? What is your usual oxygen saturation reading? (e.g., 95%)       Patient reports he doesn't check his oxygen level at home 12. TRAVEL: Have you traveled out of the country in the last month? (e.g., travel history, exposures)       No  Patient calling to make his six month appointment but told the agent he wanted to be seen sooner due to increased shortness of breath. Patient states when the weather is warmer, he is having more shortness of breath than usual. Patient states he is fine when he is in the house. No acute appointments available soon. Attempted to schedule patient an appointment with PCP but patient refused. Patient is asking if he can be seen sooner than an October appointment. Patient was transferred back to the agent to be scheduled for his six month appointment.  Protocols used: Breathing Difficulty-A-AH

## 2023-10-08 NOTE — Telephone Encounter (Signed)
 Spoke with patient regarding prior message. Offered patient several office visit's with Dr.Wert patient stated he will need to call back .Advised patient our office is closed and will open up at 8am Monday .

## 2023-11-05 ENCOUNTER — Other Ambulatory Visit: Payer: Self-pay | Admitting: Family Medicine

## 2023-11-05 DIAGNOSIS — F5104 Psychophysiologic insomnia: Secondary | ICD-10-CM

## 2023-11-05 MED ORDER — MIRTAZAPINE 7.5 MG PO TABS: 7.5000 mg | ORAL_TABLET | Freq: Every day | ORAL | 0 refills | Status: DC

## 2023-11-05 NOTE — Addendum Note (Signed)
 Addended by: Najia Hurlbutt D on: 11/05/2023 02:34 PM   Modules accepted: Orders

## 2023-11-05 NOTE — Telephone Encounter (Signed)
 Scheduled an apt for 11/24/2023 next available. Can pt get refill until apt? Please call back.

## 2023-11-05 NOTE — Telephone Encounter (Signed)
 Jeffrey Mendez pt NTBS 30-d given 10/05/23
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
## 2023-11-09 ENCOUNTER — Ambulatory Visit: Admitting: Internal Medicine

## 2023-11-09 ENCOUNTER — Encounter: Payer: Self-pay | Admitting: Internal Medicine

## 2023-11-09 VITALS — BP 118/70 | HR 50 | Ht 66.0 in | Wt 117.0 lb

## 2023-11-09 DIAGNOSIS — J449 Chronic obstructive pulmonary disease, unspecified: Secondary | ICD-10-CM

## 2023-11-09 MED ORDER — BREZTRI AEROSPHERE 160-9-4.8 MCG/ACT IN AERO: INHALATION_SPRAY | RESPIRATORY_TRACT | Status: AC

## 2023-11-09 NOTE — Patient Instructions (Signed)
No change in medications   Please schedule a follow up visit in 12  months but call sooner if needed  

## 2023-11-09 NOTE — Assessment & Plan Note (Addendum)
 2PPD smoker X 44 yrs.> quit 04/2022 but worse since fx sternum 11/17/22 - Echo   09/11/22 ok x mild RV dysfunction , Nl RA - PFT's  04/09/2013  FEV1 2.52 (80 % ) ratio 0.61  and FV curve minimally concave  = GOLD 1  - 05/03/2023 mild pseudowheeze with FVC and very dry mouth on breztri  so rec try symbicort  80 2bid and f/u with all meds in [facility] office > thinks symbicort  worked better but breztri  gets it free as of 06/14/2023 >>> rec breztri  one bid or 2 each am perhaps via spacer vs resume symbicort  80/ ok to hold off pfts due to brain aneurysm unless needed for preop eval.  - 11/09/2023  After extensive coaching inhaler device,  effectiveness =    75% (short ti on hfa) > continue Breztri      Group E in terms of symptoms/risk so  laba/lama/ICS  therefore appropriate rx at this point >>>  Breztri  2 bid   and approp SABA prn.           Each maintenance medication was reviewed in detail including emphasizing most importantly the difference between maintenance and prns and under what circumstances the prns are to be triggered using an action plan format where appropriate.  Total time for H and P, chart review, counseling, reviewing hfa  device(s) and generating customized AVS unique to this office visit / same day charting = 25 min

## 2023-11-24 ENCOUNTER — Ambulatory Visit: Admitting: Family Medicine

## 2023-11-29 ENCOUNTER — Encounter: Payer: Self-pay | Admitting: Family Medicine

## 2023-11-29 ENCOUNTER — Ambulatory Visit (INDEPENDENT_AMBULATORY_CARE_PROVIDER_SITE_OTHER): Admitting: Family Medicine

## 2023-11-29 VITALS — BP 122/83 | HR 70 | Temp 98.5°F | Ht 66.0 in | Wt 115.4 lb

## 2023-11-29 DIAGNOSIS — Z23 Encounter for immunization: Secondary | ICD-10-CM

## 2023-11-29 DIAGNOSIS — K219 Gastro-esophageal reflux disease without esophagitis: Secondary | ICD-10-CM

## 2023-11-29 DIAGNOSIS — F331 Major depressive disorder, recurrent, moderate: Secondary | ICD-10-CM

## 2023-11-29 DIAGNOSIS — F5104 Psychophysiologic insomnia: Secondary | ICD-10-CM | POA: Diagnosis not present

## 2023-11-29 DIAGNOSIS — E782 Mixed hyperlipidemia: Secondary | ICD-10-CM

## 2023-11-29 DIAGNOSIS — J449 Chronic obstructive pulmonary disease, unspecified: Secondary | ICD-10-CM | POA: Diagnosis not present

## 2023-11-29 DIAGNOSIS — Z681 Body mass index (BMI) 19 or less, adult: Secondary | ICD-10-CM

## 2023-11-29 DIAGNOSIS — E440 Moderate protein-calorie malnutrition: Secondary | ICD-10-CM

## 2023-11-29 DIAGNOSIS — F419 Anxiety disorder, unspecified: Secondary | ICD-10-CM

## 2023-11-29 DIAGNOSIS — R64 Cachexia: Secondary | ICD-10-CM

## 2023-11-29 MED ORDER — BUSPIRONE HCL 5 MG PO TABS
2.5000 mg | ORAL_TABLET | Freq: Three times a day (TID) | ORAL | 3 refills | Status: AC
Start: 2023-11-29 — End: ?

## 2023-11-29 MED ORDER — MIRTAZAPINE 7.5 MG PO TABS
7.5000 mg | ORAL_TABLET | Freq: Every day | ORAL | 3 refills | Status: AC
Start: 2023-11-29 — End: ?

## 2023-11-29 NOTE — Progress Notes (Unsigned)
   Established Patient Office Visit  Subjective   Patient ID: Jeffrey Mendez, male    DOB: 22-Apr-1955  Age: 68 y.o. MRN: [id number]  Chief Complaint  Patient presents with  . Medical Management of Chronic Issues    HPI      11/29/2023    4:13 PM 07/23/2023    9:55 AM 01/11/2023    2:10 PM  Depression screen PHQ 2/9  Decreased Interest 0 0 0  Down, Depressed, Hopeless 0 1 0  PHQ - 2 Score 0 1 0  Altered sleeping 2 1 0  Tired, decreased energy 3 1 1   Change in appetite 0 0 0  Feeling bad or failure about yourself  0 2 0  Trouble concentrating 0 0 0  Moving slowly or fidgety/restless 0 0 0  Suicidal thoughts 0 0 0  PHQ-9 Score 5 5 1   Difficult doing work/chores Not difficult at all Not difficult at all Somewhat difficult      11/29/2023    4:13 PM 01/11/2023    2:10 PM 10/22/2022    1:25 PM 10/05/2022    3:27 PM  GAD 7 : Generalized Anxiety Score  Nervous, Anxious, on Edge 3 3 0 3  Control/stop worrying 3 1 0 0  Worry too much - different things 3 0 0 0  Trouble relaxing 0 1 0 0  Restless 0 1 0 1  Easily annoyed or irritable 0 1 0 0  Afraid - awful might happen 0 0 0 0  Total GAD 7 Score 9 7 0 4  Anxiety Difficulty Not difficult at all Somewhat difficult Not difficult at all Not difficult at all     {History (Optional):23778}  ROS    Objective:     BP 122/83   Pulse 70   Temp 98.5 F (36.9 C) (Temporal)   Ht 5' 6 (1.676 m)   Wt 115 lb 6.4 oz (52.3 kg)   SpO2 97%   BMI 18.63 kg/m  Wt Readings from Last 3 Encounters:  11/29/23 115 lb 6.4 oz (52.3 kg)  11/09/23 117 lb (53.1 kg)  07/23/23 126 lb (57.2 kg)      Physical Exam   No results found for any visits on 11/29/23.  {Labs (Optional):23779}  The 10-year ASCVD risk score (Arnett DK, et al., 2019) is: 15.2%    Assessment & Plan:   Chronic insomnia  Anxiety  Major depressive disorder, recurrent episode, moderate (HCC)  Chronic obstructive pulmonary disease, unspecified COPD type  (HCC) -     Pneumococcal conjugate vaccine 20-valent  Mixed hyperlipidemia  Gastroesophageal reflux disease, unspecified whether esophagitis present  Encounter for immunization -     Flu vaccine HIGH DOSE PF(Fluzone Trivalent)     No follow-ups on file.    Annabella CHRISTELLA Search, FNP

## 2023-11-30 LAB — CBC WITH DIFFERENTIAL/PLATELET
Basophils Absolute: 0 x10E3/uL (ref 0.0–0.2)
Basos: 0 %
EOS (ABSOLUTE): 0.1 x10E3/uL (ref 0.0–0.4)
Eos: 2 %
Hematocrit: 40.8 % (ref 37.5–51.0)
Hemoglobin: 13.8 g/dL (ref 13.0–17.7)
Immature Grans (Abs): 0 x10E3/uL (ref 0.0–0.1)
Immature Granulocytes: 0 %
Lymphocytes Absolute: 2 x10E3/uL (ref 0.7–3.1)
Lymphs: 36 %
MCH: 31.4 pg (ref 26.6–33.0)
MCHC: 33.8 g/dL (ref 31.5–35.7)
MCV: 93 fL (ref 79–97)
Monocytes Absolute: 0.4 x10E3/uL (ref 0.1–0.9)
Monocytes: 7 %
Neutrophils Absolute: 3 x10E3/uL (ref 1.4–7.0)
Neutrophils: 55 %
Platelets: 195 x10E3/uL (ref 150–450)
RBC: 4.39 x10E6/uL (ref 4.14–5.80)
RDW: 12.8 % (ref 11.6–15.4)
WBC: 5.6 x10E3/uL (ref 3.4–10.8)

## 2023-11-30 LAB — CMP14+EGFR
ALT: 14 IU/L (ref 0–44)
AST: 24 IU/L (ref 0–40)
Albumin: 4.2 g/dL (ref 3.9–4.9)
Alkaline Phosphatase: 106 IU/L (ref 47–123)
BUN/Creatinine Ratio: 13 (ref 10–24)
BUN: 12 mg/dL (ref 8–27)
Bilirubin Total: 0.2 mg/dL (ref 0.0–1.2)
CO2: 27 mmol/L (ref 20–29)
Calcium: 9.7 mg/dL (ref 8.6–10.2)
Chloride: 101 mmol/L (ref 96–106)
Creatinine, Ser: 0.95 mg/dL (ref 0.76–1.27)
Globulin, Total: 1.7 g/dL (ref 1.5–4.5)
Glucose: 99 mg/dL (ref 70–99)
Potassium: 5 mmol/L (ref 3.5–5.2)
Sodium: 142 mmol/L (ref 134–144)
Total Protein: 5.9 g/dL — ABNORMAL LOW (ref 6.0–8.5)
eGFR: 88 mL/min/1.73 (ref >59)

## 2023-11-30 LAB — TSH: TSH: 4.02 u[IU]/mL (ref 0.450–4.500)

## 2023-11-30 LAB — LIPID PANEL
Chol/HDL Ratio: 2.8 ratio (ref 0.0–5.0)
Cholesterol, Total: 169 mg/dL (ref 100–199)
HDL: 61 mg/dL (ref >39)
LDL Chol Calc (NIH): 97 mg/dL (ref 0–99)
Triglycerides: 57 mg/dL (ref 0–149)
VLDL Cholesterol Cal: 11 mg/dL (ref 5–40)

## 2023-12-01 ENCOUNTER — Encounter: Payer: Self-pay | Admitting: Family Medicine

## 2023-12-01 ENCOUNTER — Ambulatory Visit: Payer: Self-pay | Admitting: Family Medicine

## 2023-12-01 DIAGNOSIS — Z681 Body mass index (BMI) 19 or less, adult: Secondary | ICD-10-CM | POA: Insufficient documentation

## 2023-12-01 DIAGNOSIS — E440 Moderate protein-calorie malnutrition: Secondary | ICD-10-CM | POA: Insufficient documentation

## 2023-12-01 MED ORDER — ENSURE PLUS HIGH PROTEIN PO LIQD
1.0000 | Freq: Two times a day (BID) | ORAL | 12 refills | Status: DC
Start: 2023-12-01 — End: 2023-12-08

## 2023-12-02 ENCOUNTER — Other Ambulatory Visit: Payer: Self-pay | Admitting: Family Medicine

## 2023-12-02 DIAGNOSIS — R64 Cachexia: Secondary | ICD-10-CM

## 2023-12-02 DIAGNOSIS — E440 Moderate protein-calorie malnutrition: Secondary | ICD-10-CM

## 2023-12-02 DIAGNOSIS — Z681 Body mass index (BMI) 19 or less, adult: Secondary | ICD-10-CM

## 2023-12-02 NOTE — Telephone Encounter (Signed)
 Copied from CRM [id number]. Topic: Clinical - Medication Refill >> Dec 02, 2023  3:30 PM Zebedee SAUNDERS wrote: Medication: feeding supplement (ENSURE PLUS HIGH PROTEIN) LIQD  Has the patient contacted their pharmacy? Yes (Agent: If no, request that the patient contact the pharmacy for the refill. If patient does not wish to contact the pharmacy document the reason why and proceed with request.) (Agent: If yes, when and what did the pharmacy advise?)  This is the patient's preferred pharmacy:   [facility] [address] [place], [place] [address] Ph:  [phone] Is this the correct pharmacy for this prescription? Yes If no, delete pharmacy and type the correct one.   Has the prescription been filled recently? No  Is the patient out of the medication? Yes  Has the patient been seen for an appointment in the last year OR does the patient have an upcoming appointment? Yes  Can we respond through MyChart? Yes  Agent: Please be advised that Rx refills may take up to 3 business days. We ask that you follow-up with your pharmacy.

## 2023-12-06 ENCOUNTER — Telehealth: Payer: Self-pay | Admitting: Family Medicine

## 2023-12-06 DIAGNOSIS — Z681 Body mass index (BMI) 19 or less, adult: Secondary | ICD-10-CM

## 2023-12-06 DIAGNOSIS — E440 Moderate protein-calorie malnutrition: Secondary | ICD-10-CM

## 2023-12-06 DIAGNOSIS — M67439 Ganglion, unspecified wrist: Secondary | ICD-10-CM

## 2023-12-06 DIAGNOSIS — R64 Cachexia: Secondary | ICD-10-CM

## 2023-12-06 NOTE — Telephone Encounter (Signed)
 Copied from CRM #[id number]. Topic: Clinical - Prescription Issue >> Dec 06, 2023 11:29 AM Wess RAMAN wrote: Reason for CRM: Patient needs prescription for feeding supplement (ENSURE PLUS HIGH PROTEIN) LI[facility]D [facility] being out.  Callback #: [id number]  [phone] [place] [place] - [address] [place] [address] Phone: [phone] Fax: [phone] Hours: Not open 24 hours

## 2023-12-08 ENCOUNTER — Telehealth: Payer: Self-pay

## 2023-12-08 MED ORDER — ENSURE PLUS HIGH PROTEIN PO LIQD: 1.0000 | Freq: Two times a day (BID) | ORAL | 12 refills | Status: AC

## 2023-12-08 NOTE — Addendum Note (Signed)
 Addended by: RANDINE ARNULFO MATSU on: 12/08/2023 05:09 PM   Modules accepted: Orders

## 2023-12-08 NOTE — Telephone Encounter (Signed)
 [facility] [place] as requested and pt aware. Pt asking about referral to ortho for ganglion cyst on wrist. Referral placed.

## 2023-12-08 NOTE — Telephone Encounter (Signed)
 Copied from CRM #[id number]. Topic: Clinical - Medical Advice >> Dec 08, 2023  1:29 PM Harlene ORN wrote: Reason for CRM: Sister of patient called to check on his prescription order for protein drinks for his low protein. Also, please follow up with the patient about having scheduled with his hand specialist. Has not heard from anyone for a few weeks now.  sister dorothe sharps [phone]

## 2023-12-08 NOTE — Telephone Encounter (Signed)
Refer to previous phone call  

## 2023-12-28 ENCOUNTER — Other Ambulatory Visit: Payer: Self-pay | Admitting: Family Medicine

## 2024-01-05 ENCOUNTER — Other Ambulatory Visit: Payer: Self-pay | Admitting: Family Medicine

## 2024-07-25 ENCOUNTER — Ambulatory Visit: Payer: Self-pay
